# Patient Record
Sex: Male | Born: 1940 | Race: White | Hispanic: No | Marital: Single | Smoking: Former smoker
Health system: Southern US, Community
[De-identification: ages and names within clinical notes are randomized; demographics above are authoritative.]

## PROBLEM LIST (undated history)

## (undated) DIAGNOSIS — Z87891 Personal history of nicotine dependence: Secondary | ICD-10-CM

## (undated) DIAGNOSIS — T300 Burn of unspecified body region, unspecified degree: Secondary | ICD-10-CM

## (undated) DIAGNOSIS — I2510 Atherosclerotic heart disease of native coronary artery without angina pectoris: Secondary | ICD-10-CM

## (undated) DIAGNOSIS — E119 Type 2 diabetes mellitus without complications: Secondary | ICD-10-CM

## (undated) DIAGNOSIS — E7800 Pure hypercholesterolemia, unspecified: Secondary | ICD-10-CM

## (undated) DIAGNOSIS — E669 Obesity, unspecified: Secondary | ICD-10-CM

## (undated) DIAGNOSIS — F419 Anxiety disorder, unspecified: Secondary | ICD-10-CM

## (undated) DIAGNOSIS — E785 Hyperlipidemia, unspecified: Secondary | ICD-10-CM

## (undated) HISTORY — PX: CORONARY ARTERY BYPASS GRAFT: SHX141

## (undated) HISTORY — DX: Hyperlipidemia, unspecified: E78.5

## (undated) HISTORY — DX: Burn of unspecified body region, unspecified degree: T30.0

## (undated) HISTORY — DX: Personal history of nicotine dependence: Z87.891

## (undated) HISTORY — DX: Obesity, unspecified: E66.9

---

## 2003-08-26 ENCOUNTER — Emergency Department (HOSPITAL_COMMUNITY): Admission: EM | Admit: 2003-08-26 | Discharge: 2003-08-26 | Payer: Self-pay | Admitting: Family Medicine

## 2004-02-07 ENCOUNTER — Emergency Department (HOSPITAL_COMMUNITY): Admission: EM | Admit: 2004-02-07 | Discharge: 2004-02-07 | Payer: Self-pay | Admitting: Family Medicine

## 2004-04-17 ENCOUNTER — Emergency Department (HOSPITAL_COMMUNITY): Admission: EM | Admit: 2004-04-17 | Discharge: 2004-04-17 | Payer: Self-pay | Admitting: Emergency Medicine

## 2004-06-01 ENCOUNTER — Emergency Department (HOSPITAL_COMMUNITY): Admission: EM | Admit: 2004-06-01 | Discharge: 2004-06-01 | Payer: Self-pay | Admitting: Family Medicine

## 2004-07-05 ENCOUNTER — Emergency Department (HOSPITAL_COMMUNITY): Admission: EM | Admit: 2004-07-05 | Discharge: 2004-07-05 | Payer: Self-pay | Admitting: Family Medicine

## 2004-08-31 ENCOUNTER — Emergency Department (HOSPITAL_COMMUNITY): Admission: EM | Admit: 2004-08-31 | Discharge: 2004-08-31 | Payer: Self-pay | Admitting: Family Medicine

## 2004-10-21 ENCOUNTER — Emergency Department (HOSPITAL_COMMUNITY): Admission: EM | Admit: 2004-10-21 | Discharge: 2004-10-21 | Payer: Self-pay | Admitting: Family Medicine

## 2004-11-30 ENCOUNTER — Emergency Department (HOSPITAL_COMMUNITY): Admission: AD | Admit: 2004-11-30 | Discharge: 2004-11-30 | Payer: Self-pay | Admitting: Family Medicine

## 2005-02-13 ENCOUNTER — Emergency Department (HOSPITAL_COMMUNITY): Admission: EM | Admit: 2005-02-13 | Discharge: 2005-02-13 | Payer: Self-pay | Admitting: Family Medicine

## 2005-07-05 ENCOUNTER — Emergency Department (HOSPITAL_COMMUNITY): Admission: EM | Admit: 2005-07-05 | Discharge: 2005-07-05 | Payer: Self-pay | Admitting: Family Medicine

## 2007-02-16 ENCOUNTER — Emergency Department (HOSPITAL_COMMUNITY): Admission: EM | Admit: 2007-02-16 | Discharge: 2007-02-16 | Payer: Self-pay | Admitting: Emergency Medicine

## 2009-11-20 ENCOUNTER — Inpatient Hospital Stay (HOSPITAL_COMMUNITY): Admission: AD | Admit: 2009-11-20 | Discharge: 2009-12-03 | Payer: Self-pay | Admitting: Cardiovascular Disease

## 2009-11-21 ENCOUNTER — Ambulatory Visit: Payer: Self-pay | Admitting: Thoracic Surgery (Cardiothoracic Vascular Surgery)

## 2009-11-21 ENCOUNTER — Encounter: Payer: Self-pay | Admitting: Thoracic Surgery (Cardiothoracic Vascular Surgery)

## 2009-11-29 ENCOUNTER — Ambulatory Visit: Payer: Self-pay | Admitting: Physical Medicine & Rehabilitation

## 2009-12-25 ENCOUNTER — Encounter
Admission: RE | Admit: 2009-12-25 | Discharge: 2009-12-25 | Payer: Self-pay | Admitting: Thoracic Surgery (Cardiothoracic Vascular Surgery)

## 2009-12-25 ENCOUNTER — Ambulatory Visit: Payer: Self-pay | Admitting: Thoracic Surgery (Cardiothoracic Vascular Surgery)

## 2010-09-22 LAB — BLOOD GAS, ARTERIAL
FIO2: 0.21 %
Patient temperature: 98.6
TCO2: 21 mmol/L (ref 0–100)
pCO2 arterial: 29.7 mmHg — ABNORMAL LOW (ref 35.0–45.0)
pH, Arterial: 7.446 (ref 7.350–7.450)

## 2010-09-22 LAB — POCT I-STAT, CHEM 8
BUN: 11 mg/dL (ref 6–23)
Calcium, Ion: 1.05 mmol/L — ABNORMAL LOW (ref 1.12–1.32)
Creatinine, Ser: 0.7 mg/dL (ref 0.4–1.5)
Creatinine, Ser: 0.7 mg/dL (ref 0.4–1.5)
Glucose, Bld: 165 mg/dL — ABNORMAL HIGH (ref 70–99)
HCT: 32 % — ABNORMAL LOW (ref 39.0–52.0)
Hemoglobin: 10.2 g/dL — ABNORMAL LOW (ref 13.0–17.0)
Hemoglobin: 10.9 g/dL — ABNORMAL LOW (ref 13.0–17.0)
Potassium: 3.8 mEq/L (ref 3.5–5.1)
Potassium: 4.1 mEq/L (ref 3.5–5.1)
Sodium: 133 mEq/L — ABNORMAL LOW (ref 135–145)
Sodium: 134 mEq/L — ABNORMAL LOW (ref 135–145)
Sodium: 138 mEq/L (ref 135–145)
TCO2: 21 mmol/L (ref 0–100)
TCO2: 23 mmol/L (ref 0–100)
TCO2: 23 mmol/L (ref 0–100)

## 2010-09-22 LAB — APTT: aPTT: 25 seconds (ref 24–37)

## 2010-09-22 LAB — POCT I-STAT 3, ART BLOOD GAS (G3+)
Acid-base deficit: 5 mmol/L — ABNORMAL HIGH (ref 0.0–2.0)
Bicarbonate: 22.7 mEq/L (ref 20.0–24.0)
Bicarbonate: 24.3 mEq/L — ABNORMAL HIGH (ref 20.0–24.0)
O2 Saturation: 100 %
Patient temperature: 35.9
Patient temperature: 37.5
TCO2: 22 mmol/L (ref 0–100)
TCO2: 24 mmol/L (ref 0–100)
pCO2 arterial: 23.2 mmHg — ABNORMAL LOW (ref 35.0–45.0)
pCO2 arterial: 38.9 mmHg (ref 35.0–45.0)
pH, Arterial: 7.336 — ABNORMAL LOW (ref 7.350–7.450)
pH, Arterial: 7.369 (ref 7.350–7.450)
pH, Arterial: 7.412 (ref 7.350–7.450)
pO2, Arterial: 230 mmHg — ABNORMAL HIGH (ref 80.0–100.0)
pO2, Arterial: 311 mmHg — ABNORMAL HIGH (ref 80.0–100.0)
pO2, Arterial: 84 mmHg (ref 80.0–100.0)

## 2010-09-22 LAB — POCT I-STAT 4, (NA,K, GLUC, HGB,HCT)
Glucose, Bld: 155 mg/dL — ABNORMAL HIGH (ref 70–99)
Glucose, Bld: 156 mg/dL — ABNORMAL HIGH (ref 70–99)
HCT: 26 % — ABNORMAL LOW (ref 39.0–52.0)
HCT: 30 % — ABNORMAL LOW (ref 39.0–52.0)
Hemoglobin: 12.2 g/dL — ABNORMAL LOW (ref 13.0–17.0)
Hemoglobin: 8.8 g/dL — ABNORMAL LOW (ref 13.0–17.0)
Hemoglobin: 8.8 g/dL — ABNORMAL LOW (ref 13.0–17.0)
Potassium: 3.7 mEq/L (ref 3.5–5.1)
Potassium: 4 mEq/L (ref 3.5–5.1)
Sodium: 131 mEq/L — ABNORMAL LOW (ref 135–145)

## 2010-09-22 LAB — CBC
HCT: 24.7 % — ABNORMAL LOW (ref 39.0–52.0)
HCT: 28 % — ABNORMAL LOW (ref 39.0–52.0)
HCT: 42.4 % (ref 39.0–52.0)
Hemoglobin: 10.3 g/dL — ABNORMAL LOW (ref 13.0–17.0)
Hemoglobin: 13.6 g/dL (ref 13.0–17.0)
Hemoglobin: 14.8 g/dL (ref 13.0–17.0)
Hemoglobin: 8.7 g/dL — ABNORMAL LOW (ref 13.0–17.0)
Hemoglobin: 9.7 g/dL — ABNORMAL LOW (ref 13.0–17.0)
MCHC: 34.7 g/dL (ref 30.0–36.0)
MCHC: 34.7 g/dL (ref 30.0–36.0)
MCHC: 34.8 g/dL (ref 30.0–36.0)
MCHC: 35.1 g/dL (ref 30.0–36.0)
MCHC: 35.5 g/dL (ref 30.0–36.0)
MCV: 91.8 fL (ref 78.0–100.0)
MCV: 92.3 fL (ref 78.0–100.0)
MCV: 92.7 fL (ref 78.0–100.0)
MCV: 92.8 fL (ref 78.0–100.0)
MCV: 93.5 fL (ref 78.0–100.0)
Platelets: 112 10*3/uL — ABNORMAL LOW (ref 150–400)
Platelets: 135 10*3/uL — ABNORMAL LOW (ref 150–400)
Platelets: 146 10*3/uL — ABNORMAL LOW (ref 150–400)
Platelets: 148 10*3/uL — ABNORMAL LOW (ref 150–400)
Platelets: 186 10*3/uL (ref 150–400)
Platelets: 260 10*3/uL (ref 150–400)
Platelets: 498 10*3/uL — ABNORMAL HIGH (ref 150–400)
RBC: 2.95 MIL/uL — ABNORMAL LOW (ref 4.22–5.81)
RBC: 3.03 MIL/uL — ABNORMAL LOW (ref 4.22–5.81)
RBC: 3.15 MIL/uL — ABNORMAL LOW (ref 4.22–5.81)
RBC: 3.41 MIL/uL — ABNORMAL LOW (ref 4.22–5.81)
RBC: 3.64 MIL/uL — ABNORMAL LOW (ref 4.22–5.81)
RBC: 4.21 MIL/uL — ABNORMAL LOW (ref 4.22–5.81)
RDW: 13.1 % (ref 11.5–15.5)
RDW: 13.3 % (ref 11.5–15.5)
WBC: 11.2 10*3/uL — ABNORMAL HIGH (ref 4.0–10.5)
WBC: 11.4 10*3/uL — ABNORMAL HIGH (ref 4.0–10.5)
WBC: 12.2 10*3/uL — ABNORMAL HIGH (ref 4.0–10.5)
WBC: 13.3 10*3/uL — ABNORMAL HIGH (ref 4.0–10.5)
WBC: 13.3 10*3/uL — ABNORMAL HIGH (ref 4.0–10.5)
WBC: 13.6 10*3/uL — ABNORMAL HIGH (ref 4.0–10.5)
WBC: 14 10*3/uL — ABNORMAL HIGH (ref 4.0–10.5)
WBC: 14.6 10*3/uL — ABNORMAL HIGH (ref 4.0–10.5)

## 2010-09-22 LAB — BASIC METABOLIC PANEL
BUN: 10 mg/dL (ref 6–23)
BUN: 10 mg/dL (ref 6–23)
BUN: 11 mg/dL (ref 6–23)
BUN: 6 mg/dL (ref 6–23)
BUN: 8 mg/dL (ref 6–23)
BUN: 9 mg/dL (ref 6–23)
CO2: 23 mEq/L (ref 19–32)
CO2: 26 mEq/L (ref 19–32)
Calcium: 6.9 mg/dL — ABNORMAL LOW (ref 8.4–10.5)
Calcium: 7.4 mg/dL — ABNORMAL LOW (ref 8.4–10.5)
Calcium: 8.5 mg/dL (ref 8.4–10.5)
Calcium: 8.7 mg/dL (ref 8.4–10.5)
Chloride: 100 mEq/L (ref 96–112)
Chloride: 102 mEq/L (ref 96–112)
Chloride: 104 mEq/L (ref 96–112)
Creatinine, Ser: 0.62 mg/dL (ref 0.4–1.5)
Creatinine, Ser: 0.64 mg/dL (ref 0.4–1.5)
Creatinine, Ser: 0.64 mg/dL (ref 0.4–1.5)
Creatinine, Ser: 0.66 mg/dL (ref 0.4–1.5)
Creatinine, Ser: 0.66 mg/dL (ref 0.4–1.5)
GFR calc Af Amer: >60 mL/min (ref >60)
GFR calc Af Amer: >60 mL/min (ref >60)
GFR calc Af Amer: >60 mL/min (ref >60)
GFR calc non Af Amer: >60 mL/min (ref >60)
GFR calc non Af Amer: >60 mL/min (ref >60)
GFR calc non Af Amer: >60 mL/min (ref >60)
GFR calc non Af Amer: >60 mL/min (ref >60)
Glucose, Bld: 113 mg/dL — ABNORMAL HIGH (ref 70–99)
Glucose, Bld: 117 mg/dL — ABNORMAL HIGH (ref 70–99)
Glucose, Bld: 138 mg/dL — ABNORMAL HIGH (ref 70–99)
Potassium: 4 mEq/L (ref 3.5–5.1)
Potassium: 4.2 mEq/L (ref 3.5–5.1)
Sodium: 133 mEq/L — ABNORMAL LOW (ref 135–145)
Sodium: 134 mEq/L — ABNORMAL LOW (ref 135–145)

## 2010-09-22 LAB — GLUCOSE, CAPILLARY
Glucose-Capillary: 104 mg/dL — ABNORMAL HIGH (ref 70–99)
Glucose-Capillary: 105 mg/dL — ABNORMAL HIGH (ref 70–99)
Glucose-Capillary: 125 mg/dL — ABNORMAL HIGH (ref 70–99)
Glucose-Capillary: 133 mg/dL — ABNORMAL HIGH (ref 70–99)
Glucose-Capillary: 135 mg/dL — ABNORMAL HIGH (ref 70–99)
Glucose-Capillary: 135 mg/dL — ABNORMAL HIGH (ref 70–99)
Glucose-Capillary: 138 mg/dL — ABNORMAL HIGH (ref 70–99)
Glucose-Capillary: 140 mg/dL — ABNORMAL HIGH (ref 70–99)
Glucose-Capillary: 142 mg/dL — ABNORMAL HIGH (ref 70–99)
Glucose-Capillary: 147 mg/dL — ABNORMAL HIGH (ref 70–99)
Glucose-Capillary: 147 mg/dL — ABNORMAL HIGH (ref 70–99)
Glucose-Capillary: 151 mg/dL — ABNORMAL HIGH (ref 70–99)
Glucose-Capillary: 153 mg/dL — ABNORMAL HIGH (ref 70–99)
Glucose-Capillary: 158 mg/dL — ABNORMAL HIGH (ref 70–99)
Glucose-Capillary: 161 mg/dL — ABNORMAL HIGH (ref 70–99)
Glucose-Capillary: 165 mg/dL — ABNORMAL HIGH (ref 70–99)
Glucose-Capillary: 166 mg/dL — ABNORMAL HIGH (ref 70–99)
Glucose-Capillary: 169 mg/dL — ABNORMAL HIGH (ref 70–99)
Glucose-Capillary: 176 mg/dL — ABNORMAL HIGH (ref 70–99)
Glucose-Capillary: 180 mg/dL — ABNORMAL HIGH (ref 70–99)
Glucose-Capillary: 183 mg/dL — ABNORMAL HIGH (ref 70–99)
Glucose-Capillary: 183 mg/dL — ABNORMAL HIGH (ref 70–99)
Glucose-Capillary: 203 mg/dL — ABNORMAL HIGH (ref 70–99)
Glucose-Capillary: 206 mg/dL — ABNORMAL HIGH (ref 70–99)
Glucose-Capillary: 215 mg/dL — ABNORMAL HIGH (ref 70–99)
Glucose-Capillary: 217 mg/dL — ABNORMAL HIGH (ref 70–99)
Glucose-Capillary: 71 mg/dL (ref 70–99)
Glucose-Capillary: 79 mg/dL (ref 70–99)
Glucose-Capillary: 86 mg/dL (ref 70–99)
Glucose-Capillary: 87 mg/dL (ref 70–99)
Glucose-Capillary: 88 mg/dL (ref 70–99)
Glucose-Capillary: 90 mg/dL (ref 70–99)
Glucose-Capillary: 92 mg/dL (ref 70–99)

## 2010-09-22 LAB — CROSSMATCH: ABO/RH(D): A POS

## 2010-09-22 LAB — POCT I-STAT 3, VENOUS BLOOD GAS (G3P V)
Bicarbonate: 21.2 mEq/L (ref 20.0–24.0)
pH, Ven: 7.471 — ABNORMAL HIGH (ref 7.250–7.300)
pO2, Ven: 28 mmHg — CL (ref 30.0–45.0)

## 2010-09-22 LAB — CREATININE, SERUM
Creatinine, Ser: 0.65 mg/dL (ref 0.4–1.5)
Creatinine, Ser: 0.74 mg/dL (ref 0.4–1.5)
GFR calc Af Amer: >60 mL/min (ref >60)
GFR calc Af Amer: >60 mL/min (ref >60)
GFR calc non Af Amer: >60 mL/min (ref >60)

## 2010-09-22 LAB — URINALYSIS, ROUTINE W REFLEX MICROSCOPIC
Bilirubin Urine: NEGATIVE
Glucose, UA: NEGATIVE mg/dL
Hgb urine dipstick: NEGATIVE
Ketones, ur: 40 mg/dL — AB
Nitrite: NEGATIVE
Protein, ur: 30 mg/dL — AB
pH: 6 (ref 5.0–8.0)

## 2010-09-22 LAB — COMPREHENSIVE METABOLIC PANEL
AST: 20 U/L (ref 0–37)
AST: 20 U/L (ref 0–37)
Albumin: 3.3 g/dL — ABNORMAL LOW (ref 3.5–5.2)
BUN: 13 mg/dL (ref 6–23)
CO2: 23 mEq/L (ref 19–32)
CO2: 25 mEq/L (ref 19–32)
Calcium: 8 mg/dL — ABNORMAL LOW (ref 8.4–10.5)
Calcium: 8.4 mg/dL (ref 8.4–10.5)
Chloride: 98 mEq/L (ref 96–112)
Creatinine, Ser: 0.67 mg/dL (ref 0.4–1.5)
Creatinine, Ser: 0.94 mg/dL (ref 0.4–1.5)
GFR calc Af Amer: >60 mL/min (ref >60)
GFR calc Af Amer: >60 mL/min (ref >60)
GFR calc non Af Amer: >60 mL/min (ref >60)
GFR calc non Af Amer: >60 mL/min (ref >60)
Sodium: 129 mEq/L — ABNORMAL LOW (ref 135–145)
Total Protein: 6.1 g/dL (ref 6.0–8.3)
Total Protein: 6.9 g/dL (ref 6.0–8.3)

## 2010-09-22 LAB — MAGNESIUM
Magnesium: 2.6 mg/dL — ABNORMAL HIGH (ref 1.5–2.5)
Magnesium: 3.6 mg/dL — ABNORMAL HIGH (ref 1.5–2.5)

## 2010-09-22 LAB — HEMOGLOBIN AND HEMATOCRIT, BLOOD: Hemoglobin: 8.9 g/dL — ABNORMAL LOW (ref 13.0–17.0)

## 2010-09-22 LAB — PROTIME-INR
INR: 1.03 (ref 0.00–1.49)
INR: 1.36 (ref 0.00–1.49)
Prothrombin Time: 16.7 seconds — ABNORMAL HIGH (ref 11.6–15.2)

## 2010-09-22 LAB — MRSA PCR SCREENING: MRSA by PCR: NEGATIVE

## 2010-09-22 LAB — POCT I-STAT GLUCOSE: Glucose, Bld: 136 mg/dL — ABNORMAL HIGH (ref 70–99)

## 2010-09-22 LAB — ABO/RH: ABO/RH(D): A POS

## 2010-09-22 LAB — CARDIAC PANEL(CRET KIN+CKTOT+MB+TROPI)
Relative Index: INVALID (ref 0.0–2.5)
Total CK: 50 U/L (ref 7–232)

## 2010-11-18 NOTE — Assessment & Plan Note (Signed)
OFFICE VISIT   [name], [name]  DOB:  1940/12/16                                        December 25, 2009  CHART #:  [id number]   HISTORY:  The patient is a 69 year old gentleman who presented back in  May with unstable angina.  He had critical left main and three-vessel  disease and underwent coronary bypass grafting x5 on May 20.  Postoperatively, his course was uncomplicated.  He was discharged on May  30 and to a skilled nursing facility.  He now is at home and doing well.  He has been walking on a regular basis.  He says he does get tired in  the afternoon and have to take a nap.  Usually takes one pain pill  before he goes to bed and sometimes will wake at middle of the night and  take the second one, but he has not been able to take them during the  day at all.  He says his exercise tolerance is good.  He has not had any  recurrent anginal-type symptoms.   His current medications are Tussionex 5 mL t.i.d. p.r.n., Lopressor 25  mg b.i.d., oxycodone 5 mg p.r.n., simvastatin 40 mg daily, aspirin 325  mg daily, NovoLog 70/30 insulin, and also occasionally using Tylenol PM  for sleep.   He has no known drug allergies.   PHYSICAL EXAMINATION:  His blood pressure is 134/79, pulse 99,  respirations 18, oxygen saturation is 98%.  Neurologically, he is alert  and oriented x3 with no focal deficits.  His lungs are clear with equal  breath sounds bilaterally.  His cardiac exam has regular rate and  rhythm.  Normal S1 and S2.  No rubs, murmurs, or gallops.  Sternal  incision is clean, dry, and intact.  Rash on his right anterior chest  wall is nearly completely resolved.  His sternum is stable.  There is no  sign of infections.  Leg wounds healing well.  There is no peripheral  edema.   Chest x-ray shows postoperative changes and no significant effusions or  infiltrates.   IMPRESSION:  The patient is doing well at this point in time.  He is now  about a month  out from urgent surgery for critical left main disease.  His exercise tolerance is good.  He is having minimal discomfort.  I did  give him a prescription for Ambien 5 mg tablets one p.o. q.h.s. p.r.n.  at his request to help him sleep at night.  I did caution him not to  lift any objects greater than 10 pounds for another 3 weeks.  I  encouraged him to continue increasing his exercise as tolerated.  He may  begin driving.  Appropriate precautions were discussed.  He will follow  up with Dr. Tresa Endo.   I would be happy to see him back in the future if I can be of any  further assistance with his care.   Salvatore Decent Dorris Fetch, M.D.  Electronically Signed   SCH/MEDQ  D:  12/25/2009  T:  12/26/2009  Job:  [id number]

## 2011-09-14 ENCOUNTER — Encounter (HOSPITAL_BASED_OUTPATIENT_CLINIC_OR_DEPARTMENT_OTHER): Payer: Medicare Other | Attending: Internal Medicine

## 2011-09-14 DIAGNOSIS — Z7982 Long term (current) use of aspirin: Secondary | ICD-10-CM | POA: Insufficient documentation

## 2011-09-14 DIAGNOSIS — Z79899 Other long term (current) drug therapy: Secondary | ICD-10-CM | POA: Insufficient documentation

## 2011-09-14 DIAGNOSIS — E1169 Type 2 diabetes mellitus with other specified complication: Secondary | ICD-10-CM | POA: Insufficient documentation

## 2011-09-14 DIAGNOSIS — I2510 Atherosclerotic heart disease of native coronary artery without angina pectoris: Secondary | ICD-10-CM | POA: Insufficient documentation

## 2011-09-14 DIAGNOSIS — Z794 Long term (current) use of insulin: Secondary | ICD-10-CM | POA: Insufficient documentation

## 2011-09-14 DIAGNOSIS — G5790 Unspecified mononeuropathy of unspecified lower limb: Secondary | ICD-10-CM | POA: Insufficient documentation

## 2011-09-14 DIAGNOSIS — L97509 Non-pressure chronic ulcer of other part of unspecified foot with unspecified severity: Secondary | ICD-10-CM | POA: Insufficient documentation

## 2011-09-15 NOTE — Progress Notes (Signed)
[facility]  NAME:  DIETER, HANE NO.:  [id number]  MEDICAL RECORD NO.:  [id number]      DATE OF BIRTH:  [birth year]  PHYSICIAN:  Jonelle Sports. Lyberti Thrush, M.D.  VISIT DATE:  09/14/2011                                  OFFICE VISIT   HISTORY:  This 71 year old white male with longstanding diabetes, on insulin, is seen for evaluation of several injured areas on his feet.  The patient is somewhat vague about the details of how he wounded his feet, but apparently was doing a great deal traveling and was in a snow situation without adequate boots and his feet got quite wet.  He noticed after that some blister-like lesions on the toes and one on the heel and although I suppose it is remotely possible, these could be frost bite, I do not honestly think so in that they fairly quickly spontaneously improved.  Then subsequently, he got his feet wet again and has noted the reappearance of lesions on the tip of both halluces and also on the left second toe.  He notes that he had been wearing two pairs of socks in an effort to protect his feet and as I will discuss below, I think this may well have been a factor in some of the causation of this second series of wounds.  Whatever the wounds have not drained, have not been malodorous, have not been particularly painful and have not been associated with any systemic symptoms.  PAST MEDICAL HISTORY:  Operations include coronary artery disease with stent placements.  Other hospitalizations for depression and pancreatitis.  His regular medications are; 1. NovoLog insulin 70/30 twice daily. 2. Ambien 10 mg nightly at bedtime. 3. Aspirin 81 mg daily. 4. Fenofibrate 160 mg daily. 5. Lisinopril 5 mg daily, and he apparently was recently given a brief     course of Keflex 500 mg b.i.d. in association with these problems.     He also takes over-the-counter Unisom and a multiple vitamin.  He has no known medicinal  allergies.  FAMILY HISTORY:  Notable for diabetes type 2 and hypertension.  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  REVIEW OF SYSTEMS:  The patient has no awareness of any problems with his eyes related to diabetes and has no current visual symptomatology. CARDIOVASCULAR:  He has had no ongoing symptoms since his coronary intervention in 2011.  He has no history of pneumonia, emphysema or pulmonary disease.  He denies smoking or use of alcoholic beverages.  He has not had any additional abdominal pain since the time of his episode of pancreatitis, has no change in bowel habits.  No dyspepsia.  No jaundice or pruritus.  Bones and joints are in good shape with no major traumas in the past and no joint surgeries.  Neurologically, he has no history of stroke or seizures.  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  PHYSICAL EXAMINATION:  VITAL SIGNS:  Blood pressure is 163/87, pulse 95 and regular, respirations 18, temperature 98.6. GENERAL:  This is an alert, cooperative, but anxious and talkative gentleman who appears his stated  age of 89 years.  His random blood glucose is 120 mg/dL.  His height incidentally is 5 feet 7 inches, weight 185 pounds. SKIN:  The skin is warm and dry with normal texture, turgor and color. He has hair growth present all the way onto the dorsum of both feet. HEENT:  His mucous membranes are moist and pink. NECK:  Supple.  There is no venous engorgement.  No thyroid enlargement. No carotid bruits. CHEST:  His chest is grossly clear to auscultation and percussion. CARDIOVASCULAR:  His heart tones are of good quality and I hear no definite murmur or gallop not even S4. ABDOMEN:  His abdomen is soft, nontender without apparent organomegaly or masses. EXTREMITIES:  Free of edema.  All pulses are  palpable and he has good ABIs of 1.06 on the left and 0.99 on the right.  He has a slight open ulceration in the area of hemorrhagic trauma on the tip of his right hallux and he has a hematoma unruptured on the tip of his left hallux. On the plantar aspect of the distal phalanx of his left second toe, which is slightly hammertoe in configuration, there is a small open ulcer measuring 1.0 x 1.2 x 0.1 cm with no evidence of surrounding erythema or other such problems.  IMPRESSION:  Diabetic foot ulcers, all Wagner 2 likely secondary to thermal trauma and ill-fitting shoes.  DISPOSITION:  Incidentally, I have today looked to the shoes because these lesions on his toes appear to have been secondary to shoes that are too tightly fitting, which is always a hazard with neuropathy. Particularly in that, he has been wearing two pairs of socks in an effort to protect his toes.  Paradoxically, he has caused the tight- fitting shoes to be even tighter and I think this is clearly a factor in his present injury.  He certainly has good circulation.  There was no evidence of current infection and these should be expected to heal.  I have debrided some loose skin from the tip of the right hallux and also from the ulcer on the left second toe.  The right hallux was then dressed with application of collagen and a toe sock type dressing as is the left second toe.  The left hallux is left undressed.  He is later returned to his socks, but is placed in healing sandal bilaterally as opposed to his previous shoes.  He was given a prescription and a recommendation that he can shop to the shoe market on [address], and get to shoes of adequate length, width and depth as well as some diabetic inserts.  His followup visit will be here in 1 week.  He was advised not to change his dressings in the interim.          ______________________________ Jonelle Sports Cheryll Cockayne, M.D.     RES/MEDQ  D:   09/14/2011  T:  09/15/2011  Job:  [id number]

## 2011-10-05 ENCOUNTER — Encounter (HOSPITAL_BASED_OUTPATIENT_CLINIC_OR_DEPARTMENT_OTHER): Payer: Medicare Other | Attending: Internal Medicine

## 2011-10-05 DIAGNOSIS — Z79899 Other long term (current) drug therapy: Secondary | ICD-10-CM | POA: Insufficient documentation

## 2011-10-05 DIAGNOSIS — L97509 Non-pressure chronic ulcer of other part of unspecified foot with unspecified severity: Secondary | ICD-10-CM | POA: Insufficient documentation

## 2011-10-05 DIAGNOSIS — E1169 Type 2 diabetes mellitus with other specified complication: Secondary | ICD-10-CM | POA: Insufficient documentation

## 2011-10-12 ENCOUNTER — Encounter (HOSPITAL_BASED_OUTPATIENT_CLINIC_OR_DEPARTMENT_OTHER): Payer: Medicare Other

## 2011-11-09 ENCOUNTER — Encounter (HOSPITAL_BASED_OUTPATIENT_CLINIC_OR_DEPARTMENT_OTHER): Payer: Medicare Other | Attending: Internal Medicine

## 2011-11-09 DIAGNOSIS — Z794 Long term (current) use of insulin: Secondary | ICD-10-CM | POA: Insufficient documentation

## 2011-11-09 DIAGNOSIS — L97509 Non-pressure chronic ulcer of other part of unspecified foot with unspecified severity: Secondary | ICD-10-CM | POA: Insufficient documentation

## 2011-11-09 DIAGNOSIS — E119 Type 2 diabetes mellitus without complications: Secondary | ICD-10-CM | POA: Insufficient documentation

## 2011-11-25 NOTE — Progress Notes (Signed)
[facility]  NAME:  SEAVER, MACHIA NO.:  MEDICAL RECORD NO.:  [id number]      DATE OF BIRTH:  [birth year]  PHYSICIAN:  Joanne Gavel, M.D.              VISIT DATE:                                  OFFICE VISIT   ADMITTING DATA:  A 71 year old male with long-standing diabetes, on insulin with several injured areas to his feet, ulcerations of the right great toe and left second toe.  The patient was treated basically with collagen and cleansing.  Abnormal skin was debrided when necessary.  At the time of discharge, the wound is completely healed.  DISCHARGE RECOMMENDATIONS:  That he avoid trauma and see [place] as necessary.     Joanne Gavel, M.D.     RA/MEDQ  D:  11/24/2011  T:  11/24/2011  Job:  [id number]

## 2011-12-07 ENCOUNTER — Encounter (HOSPITAL_BASED_OUTPATIENT_CLINIC_OR_DEPARTMENT_OTHER): Payer: Medicare Other | Attending: Internal Medicine

## 2011-12-07 DIAGNOSIS — L97509 Non-pressure chronic ulcer of other part of unspecified foot with unspecified severity: Secondary | ICD-10-CM | POA: Insufficient documentation

## 2011-12-07 DIAGNOSIS — E119 Type 2 diabetes mellitus without complications: Secondary | ICD-10-CM | POA: Insufficient documentation

## 2011-12-07 DIAGNOSIS — Z794 Long term (current) use of insulin: Secondary | ICD-10-CM | POA: Insufficient documentation

## 2012-05-09 ENCOUNTER — Encounter (HOSPITAL_BASED_OUTPATIENT_CLINIC_OR_DEPARTMENT_OTHER): Payer: Medicare Other | Attending: General Surgery

## 2012-05-09 DIAGNOSIS — E1169 Type 2 diabetes mellitus with other specified complication: Secondary | ICD-10-CM | POA: Insufficient documentation

## 2012-05-09 DIAGNOSIS — L97509 Non-pressure chronic ulcer of other part of unspecified foot with unspecified severity: Secondary | ICD-10-CM | POA: Insufficient documentation

## 2012-05-09 DIAGNOSIS — I2510 Atherosclerotic heart disease of native coronary artery without angina pectoris: Secondary | ICD-10-CM | POA: Insufficient documentation

## 2012-05-09 DIAGNOSIS — Z794 Long term (current) use of insulin: Secondary | ICD-10-CM | POA: Insufficient documentation

## 2012-05-09 DIAGNOSIS — I10 Essential (primary) hypertension: Secondary | ICD-10-CM | POA: Insufficient documentation

## 2012-05-09 NOTE — Progress Notes (Signed)
[facility]  NAME:  [name], [name] NO.:  [id number]  MEDICAL RECORD NO.:  [id number]      DATE OF BIRTH:  07/15/1940  PHYSICIAN:  Ardath Sax, M.D.           VISIT DATE:                                  OFFICE VISIT   This is a 71 year old gentleman who is an insulin-dependent diabetic and has been to our clinic before because of diabetic foot ulcers.  He presently has a Wagner 1 diabetic foot ulcer on his distal phalanx of his right 2nd toe.  I debrided the blister off and dermis appears to be like it will regenerate.  He has already got diabetic shoes and I put silver alginate on it and showed him how to do it at home and he will take care of this at home himself and come back next week.  His temperature is 98.6, his blood pressure was 120/84, his respirations 19, his pulse is 70.  He has good pulses in his feet.  He has had open heart surgery.  He has also had stents replacement.  He has also had pancreatitis in the past and his medicines are only insulin and simvastatin and lisinopril.  He will return in a week.  DIAGNOSES:  Insulin-dependent diabetes, hypertension, history of coronary artery disease, and a diabetic foot ulcer on his distal phalanx right 2nd toe.     Ardath Sax, M.D.     PP/MEDQ  D:  05/09/2012  T:  05/09/2012  Job:  [id number]

## 2012-05-24 ENCOUNTER — Other Ambulatory Visit (HOSPITAL_COMMUNITY): Payer: Self-pay | Admitting: General Surgery

## 2012-05-24 DIAGNOSIS — I739 Peripheral vascular disease, unspecified: Secondary | ICD-10-CM

## 2012-05-30 ENCOUNTER — Encounter (HOSPITAL_COMMUNITY): Payer: Medicare Other

## 2012-06-06 ENCOUNTER — Encounter (HOSPITAL_BASED_OUTPATIENT_CLINIC_OR_DEPARTMENT_OTHER): Payer: Medicare Other

## 2012-06-09 ENCOUNTER — Encounter (HOSPITAL_BASED_OUTPATIENT_CLINIC_OR_DEPARTMENT_OTHER): Payer: Medicare Other | Attending: General Surgery

## 2012-06-09 DIAGNOSIS — L97509 Non-pressure chronic ulcer of other part of unspecified foot with unspecified severity: Secondary | ICD-10-CM | POA: Insufficient documentation

## 2012-06-09 DIAGNOSIS — E1169 Type 2 diabetes mellitus with other specified complication: Secondary | ICD-10-CM | POA: Insufficient documentation

## 2012-06-09 DIAGNOSIS — L84 Corns and callosities: Secondary | ICD-10-CM | POA: Insufficient documentation

## 2012-06-10 ENCOUNTER — Encounter (HOSPITAL_BASED_OUTPATIENT_CLINIC_OR_DEPARTMENT_OTHER): Payer: Medicare Other

## 2012-06-14 ENCOUNTER — Encounter (HOSPITAL_COMMUNITY): Payer: Medicare Other

## 2012-07-08 ENCOUNTER — Encounter (HOSPITAL_BASED_OUTPATIENT_CLINIC_OR_DEPARTMENT_OTHER): Payer: Medicare Other | Attending: General Surgery

## 2012-07-08 DIAGNOSIS — I10 Essential (primary) hypertension: Secondary | ICD-10-CM | POA: Insufficient documentation

## 2012-07-08 DIAGNOSIS — L97509 Non-pressure chronic ulcer of other part of unspecified foot with unspecified severity: Secondary | ICD-10-CM | POA: Insufficient documentation

## 2012-07-08 DIAGNOSIS — I2510 Atherosclerotic heart disease of native coronary artery without angina pectoris: Secondary | ICD-10-CM | POA: Insufficient documentation

## 2012-07-08 DIAGNOSIS — Z79899 Other long term (current) drug therapy: Secondary | ICD-10-CM | POA: Insufficient documentation

## 2012-07-08 DIAGNOSIS — E1169 Type 2 diabetes mellitus with other specified complication: Secondary | ICD-10-CM | POA: Insufficient documentation

## 2012-07-16 ENCOUNTER — Emergency Department (HOSPITAL_COMMUNITY)
Admission: EM | Admit: 2012-07-16 | Discharge: 2012-07-16 | Disposition: A | Discharge status: Home / Self Care | Payer: Medicare Other | Source: Home / Self Care | Attending: Family Medicine | Admitting: Family Medicine

## 2012-07-16 ENCOUNTER — Encounter (HOSPITAL_COMMUNITY): Payer: Self-pay | Admitting: Emergency Medicine

## 2012-07-16 DIAGNOSIS — H43399 Other vitreous opacities, unspecified eye: Secondary | ICD-10-CM

## 2012-07-16 DIAGNOSIS — L8990 Pressure ulcer of unspecified site, unspecified stage: Secondary | ICD-10-CM

## 2012-07-16 DIAGNOSIS — L8992 Pressure ulcer of unspecified site, stage 2: Secondary | ICD-10-CM

## 2012-07-16 HISTORY — DX: Pure hypercholesterolemia, unspecified: E78.00

## 2012-07-16 HISTORY — DX: Atherosclerotic heart disease of native coronary artery without angina pectoris: I25.10

## 2012-07-16 HISTORY — DX: Type 2 diabetes mellitus without complications: E11.9

## 2012-07-16 NOTE — ED Provider Notes (Signed)
History     CSN: [id number]  Arrival date & time 07/16/12  1208   First MD Initiated Contact with Patient 07/16/12 1257      Chief Complaint  Patient presents with  . Eye Problem    (Consider location/radiation/quality/duration/timing/severity/associated sxs/prior treatment) HPI Comments: 72 year old male with history of diabetes. Here with the following concerns: #1) has a blister in his right second toe. Patient reports that he goes to [facility] for several blisters that he had in his toes. He is currently wearing postop shoes. He was seen last at the wound care center on Friday. Next day he developed a blister in his right second toe since yesterday. Clear drainage. Denies pain. Patient wants to have that blister drained. #2) floaters in his left eye. Reports seeing intermittent dark spots in the center of his vision on the left eye. Floaters are changing with periods of no symptoms. Patient reports that his visual acuity has not changed, and when there are no dark spots he can read at baseline. Denies eye pain or eye drainage. Denies headache, dizziness, extremity weakness, numbness or paresthesia. No problems with gait or balance.    Past Medical History  Diagnosis Date  . Diabetes mellitus without complication   . Coronary artery disease   . High cholesterol     Past Surgical History  Procedure Date  . Coronary artery bypass graft     No family history on file.  History  Substance Use Topics  . Smoking status: Never Smoker   . Smokeless tobacco: Not on file  . Alcohol Use: No      Review of Systems  Constitutional: Negative for fever, chills, diaphoresis, appetite change and fatigue.  HENT: Negative for congestion.   Eyes: Negative for photophobia, pain, discharge, redness and itching.       Scotomas as per HPI  Respiratory: Negative for shortness of breath.   Cardiovascular: Negative for chest pain, palpitations and leg swelling.    Gastrointestinal: Negative for nausea, vomiting and abdominal pain.  Skin:       Right Foot pressure sores as per HPI  Neurological: Negative for dizziness, tremors, seizures, syncope, facial asymmetry, speech difficulty, weakness, numbness and headaches.    Allergies  Review of patient's allergies indicates no known allergies.  Home Medications   Current Outpatient Rx  Name  Route  Sig  Dispense  Refill  . KEFLEX PO   Oral   Take by mouth.         . FENOFIBRATE MICRONIZED PO   Oral   Take by mouth.         . INSULIN ASPART 100 UNIT/ML [place] SOLN   Subcutaneous   Inject into the skin 2 (two) times daily. 40 units am 30-40 units pm         . LISINOPRIL PO   Oral   Take by mouth.         Marland Kitchen SIMVASTATIN PO   Oral   Take by mouth.           BP 139/70  Pulse 98  Temp 98.6 F (37 C) (Oral)  Resp 19  SpO2 95%  Physical Exam  Nursing note and vitals reviewed. Constitutional: He is oriented to person, place, and time. He appears well-developed and well-nourished. No distress.  HENT:  Head: Normocephalic and atraumatic.  Right Ear: External ear normal.  Left Ear: External ear normal.  Mouth/Throat: Oropharynx is clear and moist. No oropharyngeal exudate.  Eyes: Conjunctivae  normal and EOM are normal. Pupils are equal, round, and reactive to light. Right eye exhibits no discharge. Left eye exhibits no discharge. No scleral icterus.       Left eye: No nystagmus. Limited slit lamp exam: (pupil was not dilated prior exam) no corneal abrasions or foreign bodies. No conjunctival erythema or pericilliary injection. No anterior chamber hemorrhage or hypopyon.  Limited visualization of retina with no obvious hemorrhage.  Right eye normal similar exam.   Neck: No JVD present. No thyromegaly present.  Cardiovascular: Normal rate, regular rhythm and normal heart sounds.   Pulmonary/Chest: Effort normal and breath sounds normal.  Neurological: He is alert and oriented to  person, place, and time. No cranial nerve deficit. Coordination normal.  Skin: He is not diaphoretic.       Right foot: There is a white blister with clear transudate located in dorsum of right second toe. No exposed ulcer. No significant erythema or swelling associated. No signs of over infection.  Right toes is covered with elastic gauze.  Patient wearing post op shoes.     ED Course  Procedures (including critical care time)  Labs Reviewed - No data to display No results found.   1. Pressure ulcer with abrasion, blister, partial thickness skin loss involving epidermis and/or dermis   2. Visual floaters       MDM  Impress aseptic blister over partial thickness pressure sore. Educated patient about protective effect of a noninfected blister. Recommended to avoid rupturing or disturbing the blister. His shoe was extra padded and petroleum embedded gauze applied on top of the blister to avoid a skin rubbing against the shoe border. Discuss patient eye complaint and exam with the ophthalmologist on call he is willing to see patient in his clinic next Monday. Patient denies here changes in his visual acuity and visual acuity test does not show worsening compare with the right eye. No hemorrhagic or other acute findings on limited eye examination here. Patient was instructed to go to the emergency department if persistent floaters or new symptoms like eye pain or visual changes from baseline.        Sharin Grave, MD 07/18/12 [phone]

## 2012-07-16 NOTE — ED Notes (Signed)
Reports left eye vision changes that patient noticed yesterday, described as a cloud or "dark cloud".. Denies headache.  Patient continues to have a "floater" in vision field today.  Does decline this occuring this afternoon. Patient reports he currently goes to wound center.  Last seen Friday at wound center.  Patient reports toe 2 has a blister today that he reports was not there yesterday.

## 2012-07-22 ENCOUNTER — Other Ambulatory Visit (HOSPITAL_COMMUNITY): Payer: Self-pay | Admitting: Family Medicine

## 2012-07-22 DIAGNOSIS — H34239 Retinal artery branch occlusion, unspecified eye: Secondary | ICD-10-CM

## 2012-08-04 ENCOUNTER — Emergency Department (INDEPENDENT_AMBULATORY_CARE_PROVIDER_SITE_OTHER): Payer: Medicare Other

## 2012-08-04 ENCOUNTER — Encounter (HOSPITAL_COMMUNITY): Payer: Self-pay | Admitting: Emergency Medicine

## 2012-08-04 ENCOUNTER — Emergency Department (HOSPITAL_COMMUNITY)
Admission: EM | Admit: 2012-08-04 | Discharge: 2012-08-04 | Disposition: A | Discharge status: Home / Self Care | Payer: Medicare Other | Source: Home / Self Care

## 2012-08-04 DIAGNOSIS — K5900 Constipation, unspecified: Secondary | ICD-10-CM

## 2012-08-04 LAB — POCT URINALYSIS DIP (DEVICE)
Bilirubin Urine: NEGATIVE
Glucose, UA: NEGATIVE mg/dL
Hgb urine dipstick: NEGATIVE
Ketones, ur: NEGATIVE mg/dL
Specific Gravity, Urine: 1.015 (ref 1.005–1.030)

## 2012-08-04 NOTE — ED Provider Notes (Signed)
History     CSN: [id number]  Arrival date & time 08/04/12  1212   First MD Initiated Contact with Patient 08/04/12 1244      Chief Complaint  Patient presents with  . Abdominal Pain    (Consider location/radiation/quality/duration/timing/severity/associated sxs/prior treatment) HPI Comments: This is a 72 year old male with history of type 2 diabetes mellitus, CAD and dyslipidemia. He presents with several weeks of abdominal discomfort associated with constipation. More recently been complaining of left upper quadrant pain. The pain is intermittent and does not radiate. It is usually elicited with cough or hiccups. He has not had nausea, vomiting, Diarrhea or fever. Medications include cephalexin, fenofibrate, simvastatin and lisinopril. His diabetes is treated with NovoLog.   Past Medical History  Diagnosis Date  . Diabetes mellitus without complication   . Coronary artery disease   . High cholesterol     Past Surgical History  Procedure Date  . Coronary artery bypass graft     No family history on file.  History  Substance Use Topics  . Smoking status: Never Smoker   . Smokeless tobacco: Not on file  . Alcohol Use: No      Review of Systems  Constitutional: Negative.   Respiratory: Negative.   Cardiovascular: Negative for chest pain, palpitations and leg swelling.  Gastrointestinal: Positive for abdominal pain and constipation. Negative for nausea, vomiting, diarrhea, blood in stool, abdominal distention, anal bleeding and rectal pain.  Genitourinary: Negative.   Musculoskeletal: Negative.   Skin: Positive for wound.       Wounds to the feet, blisters associated with diabetic PVD.  Neurological: Negative.   Psychiatric/Behavioral: Negative.     Allergies  Review of patient's allergies indicates no known allergies.  Home Medications   Current Outpatient Rx  Name  Route  Sig  Dispense  Refill  . KEFLEX PO   Oral   Take by mouth.         . FENOFIBRATE  MICRONIZED PO   Oral   Take by mouth.         . INSULIN ASPART 100 UNIT/ML [place] SOLN   Subcutaneous   Inject into the skin 2 (two) times daily. 40 units am 30-40 units pm         . LISINOPRIL PO   Oral   Take by mouth.         Marland Kitchen SIMVASTATIN PO   Oral   Take by mouth.           BP 135/63  Pulse 89  Temp 98.9 F (37.2 C) (Oral)  Resp 20  SpO2 99%  Physical Exam  Nursing note and vitals reviewed. Constitutional: He is oriented to person, place, and time. He appears well-developed and well-nourished. No distress.       The patient is sitting in a chair with relaxed posturing. He is in no distress whatsoever and is currently denying pain anywhere. He appears generally well with no sick behavior.  HENT:  Head: Normocephalic and atraumatic.  Eyes: Conjunctivae normal and EOM are normal.  Neck: Normal range of motion. Neck supple.  Cardiovascular: Normal rate, regular rhythm and normal heart sounds.   Pulmonary/Chest: Breath sounds normal. No respiratory distress. He has no wheezes. He has no rales.  Abdominal: Soft. Bowel sounds are normal. He exhibits no distension. There is no tenderness. There is no rebound and no guarding.       Onset of her small umbilical hernia Upon sitting up a diastasis recti is noted.  Musculoskeletal: He exhibits no edema and no tenderness.  Lymphadenopathy:    He has no cervical adenopathy.  Neurological: He is alert and oriented to person, place, and time. He exhibits normal muscle tone.  Skin: Skin is warm and dry.  Psychiatric: He has a normal mood and affect.    ED Course  Procedures (including critical care time)   Labs Reviewed  POCT URINALYSIS DIP (DEVICE)   Dg Abd 1 View  08/04/2012  *RADIOLOGY REPORT*  Clinical Data: Abdominal pain, mid epigastric region  ABDOMEN - 1 VIEW  Comparison: Prior chest x-rays 12/25/2009  Findings: Nonobstructed bowel gas pattern.  Gas stool noted throughout the colon to the level of the rectum.  No  organomegaly. Moderate volume of formed stool throughout the colon.  No radiopaque calcifications to suggest nephro or ureterolithiasis. Mild multilevel degenerative changes throughout the spine.   Patchy opacities in the bilateral lung bases.  Incompletely imaged median sternotomy and surgical changes of prior CABG.  IMPRESSION:  1.  Nonobstructed bowel gas pattern.  No acute abdominal finding. 2.  Patchy opacities the bilateral lung bases may be related to expiratory phase timing.  If there is clinical concern, consider dedicated chest x-ray.   Original Report Authenticated By: Malachy Moan, M.D.      1. Constipation       MDM  This patient has chronic constipation. The abdominal x-ray as above supports his diagnosis. Is not having abdominal pain at this time and he is unable to reproduce it in the office. Is instructed to drink plenty of fluids and purchase some MiraLax to use as directed as a laxative. Increase fiber in your diet. Instructions on constipation, diet and abdominal pain.  Hayden Rasmussen, NP 08/04/12 1445

## 2012-08-04 NOTE — ED Notes (Signed)
Pt is here for abd pain, LUQ, x3 days Pain is intermittent and will increase w/deep breaths.  Sx include: constipation x1 week; will occasionally have a small bowel movement Last bowel movement was 1220 today Denies: f/v/n/d, swelling of abd, strenuous activity, urinary prob, hematuria Had Tyle yest night  He is alert w/no signs of acute distress w/steady gait

## 2012-08-04 NOTE — ED Provider Notes (Signed)
Medical screening examination/treatment/procedure(s) were performed by non-physician practitioner and as supervising physician I was immediately available for consultation/collaboration.  Leslee Home, M.D.   Reuben Likes, MD 08/04/12 [phone]

## 2012-08-08 ENCOUNTER — Encounter (HOSPITAL_BASED_OUTPATIENT_CLINIC_OR_DEPARTMENT_OTHER): Payer: Medicare Other | Attending: General Surgery

## 2012-08-08 DIAGNOSIS — E1169 Type 2 diabetes mellitus with other specified complication: Secondary | ICD-10-CM | POA: Insufficient documentation

## 2012-08-08 DIAGNOSIS — L97509 Non-pressure chronic ulcer of other part of unspecified foot with unspecified severity: Secondary | ICD-10-CM | POA: Insufficient documentation

## 2012-08-12 ENCOUNTER — Encounter (HOSPITAL_BASED_OUTPATIENT_CLINIC_OR_DEPARTMENT_OTHER): Payer: Medicare Other

## 2012-08-17 ENCOUNTER — Encounter (HOSPITAL_COMMUNITY): Payer: Medicare Other

## 2012-08-17 ENCOUNTER — Ambulatory Visit (HOSPITAL_COMMUNITY): Payer: Medicare Other

## 2012-08-19 ENCOUNTER — Encounter (HOSPITAL_COMMUNITY): Payer: Self-pay | Admitting: *Deleted

## 2012-08-19 ENCOUNTER — Emergency Department (HOSPITAL_COMMUNITY)
Admission: EM | Admit: 2012-08-19 | Discharge: 2012-08-19 | Disposition: A | Discharge status: Home / Self Care | Payer: Medicare Other | Source: Home / Self Care | Attending: Family Medicine | Admitting: Family Medicine

## 2012-08-19 DIAGNOSIS — J069 Acute upper respiratory infection, unspecified: Secondary | ICD-10-CM

## 2012-08-19 DIAGNOSIS — B009 Herpesviral infection, unspecified: Secondary | ICD-10-CM

## 2012-08-19 MED ORDER — VALACYCLOVIR HCL 1 G PO TABS: ORAL_TABLET | ORAL | Status: DC

## 2012-08-19 NOTE — ED Provider Notes (Signed)
History     CSN: [id number]  Arrival date & time 08/19/12  1256   First MD Initiated Contact with Patient 08/19/12 1313      Chief Complaint  Patient presents with  . URI  . Blister    (Consider location/radiation/quality/duration/timing/severity/associated sxs/prior treatment) Patient is a 72 y.o. male presenting with URI. The history is provided by the patient.  URI Presenting symptoms: congestion and rhinorrhea   Presenting symptoms: no cough, no fever and no sore throat   Severity:  Mild Progression:  Unchanged Chronicity:  New Associated symptoms comment:  Onset of fever blister on upper lip.   Past Medical History  Diagnosis Date  . Diabetes mellitus without complication   . Coronary artery disease   . High cholesterol     Past Surgical History  Procedure Laterality Date  . Coronary artery bypass graft      Family History  Problem Relation Age of Onset  . Family history unknown: Yes    History  Substance Use Topics  . Smoking status: Never Smoker   . Smokeless tobacco: Not on file  . Alcohol Use: No      Review of Systems  Constitutional: Negative.  Negative for fever.  HENT: Positive for nosebleeds, congestion, rhinorrhea and mouth sores. Negative for sore throat.   Respiratory: Negative for cough.   Skin: Positive for rash.    Allergies  Review of patient's allergies indicates no known allergies.  Home Medications   Current Outpatient Rx  Name  Route  Sig  Dispense  Refill  . diazepam (VALIUM) 5 MG tablet   Oral   Take 5 mg by mouth every 12 (twelve) hours as needed for anxiety.         . Cephalexin (KEFLEX PO)   Oral   Take by mouth.         . FENOFIBRATE MICRONIZED PO   Oral   Take by mouth.         . insulin aspart (NOVOLOG) 100 UNIT/ML injection   Subcutaneous   Inject into the skin 2 (two) times daily. 40 units am 30-40 units pm         . LISINOPRIL PO   Oral   Take by mouth.         Marland Kitchen SIMVASTATIN PO    Oral   Take by mouth.         . valACYclovir (VALTREX) 1000 MG tablet      2 tabs now and 2 in 12 hrs.   4 tablet   1     BP 136/85  Pulse 97  Temp(Src) 97.8 F (36.6 C) (Oral)  SpO2 98%  Physical Exam  Nursing note and vitals reviewed. Constitutional: He appears well-developed and well-nourished.  HENT:  Head: Normocephalic.  Right Ear: External ear normal.  Nose: Mucosal edema and rhinorrhea present. Epistaxis is observed.    Mouth/Throat: Oropharynx is clear and moist.    ED Course  Procedures (including critical care time)  Labs Reviewed - No data to display No results found.   1. URI (upper respiratory infection)   2. Herpetic lesion       MDM         Linna Hoff, MD 08/19/12 [phone]

## 2012-08-19 NOTE — ED Notes (Signed)
Pt reports uri/sinus infection since Tuesday when he was evaluated by personal md. Used nasal spray as prescribed and reports that it is now yellow tinged and streaked with red. Pt has developed fever blister (has never had before) and is using otc products with little relief. 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

## 2012-08-26 ENCOUNTER — Encounter (HOSPITAL_COMMUNITY): Payer: Self-pay

## 2012-08-26 ENCOUNTER — Emergency Department (HOSPITAL_COMMUNITY)
Admission: EM | Admit: 2012-08-26 | Discharge: 2012-08-26 | Disposition: A | Discharge status: Home / Self Care | Payer: Medicare Other | Source: Home / Self Care | Attending: Family Medicine | Admitting: Family Medicine

## 2012-08-26 DIAGNOSIS — T2122XA Burn of second degree of abdominal wall, initial encounter: Secondary | ICD-10-CM

## 2012-08-26 MED ORDER — SILVER SULFADIAZINE 1 % EX CREA: TOPICAL_CREAM | Freq: Every day | CUTANEOUS | Status: DC

## 2012-08-26 NOTE — ED Provider Notes (Signed)
History     CSN: [id number]  Arrival date & time 08/26/12  1242   First MD Initiated Contact with Patient 08/26/12 1303      Chief Complaint  Patient presents with  . Burn    (Consider location/radiation/quality/duration/timing/severity/associated sxs/prior treatment) HPI Comments: 72 year old diabetic male here complaining tender red area in his belly. Patient sustained a burn injury 2 days ago while he was preparing a meal and let to hot/voiding water run down over his abdomen and over his clothing. He states that he had a central blisters that ruptured. No weeping or drainage. Pain is minimal. Has been using triple antibiotic ointment over-the-counter.   Past Medical History  Diagnosis Date  . Diabetes mellitus without complication   . Coronary artery disease   . High cholesterol     Past Surgical History  Procedure Laterality Date  . Coronary artery bypass graft      History reviewed. No pertinent family history.  History  Substance Use Topics  . Smoking status: Never Smoker   . Smokeless tobacco: Not on file  . Alcohol Use: No      Review of Systems  Constitutional: Negative for fever and chills.  Skin:       Skin burn injury as per history of present illness.  All other systems reviewed and are negative.    Allergies  Review of patient's allergies indicates no known allergies.  Home Medications   Current Outpatient Rx  Name  Route  Sig  Dispense  Refill  . diazepam (VALIUM) 5 MG tablet   Oral   Take 5 mg by mouth every 12 (twelve) hours as needed for anxiety.         . FENOFIBRATE MICRONIZED PO   Oral   Take by mouth.         . insulin aspart (NOVOLOG) 100 UNIT/ML injection   Subcutaneous   Inject into the skin 2 (two) times daily. 40 units am 30-40 units pm         . LISINOPRIL PO   Oral   Take by mouth.         . silver sulfADIAZINE (SILVADENE) 1 % cream   Topical   Apply topically daily.   50 g   0   . SIMVASTATIN PO  Oral   Take by mouth.         . valACYclovir (VALTREX) 1000 MG tablet      2 tabs now and 2 in 12 hrs.   4 tablet   1     BP 140/94  Pulse 108  Temp(Src) 98.3 F (36.8 C) (Oral)  Resp 16  SpO2 95%  Physical Exam  Nursing note and vitals reviewed. Constitutional: He is oriented to person, place, and time. He appears well-developed and well-nourished. No distress.  Cardiovascular: Normal heart sounds.   Pulmonary/Chest: Breath sounds normal.  Neurological: He is alert and oriented to person, place, and time.  Skin: He is not diaphoretic.  There is a superficial first-degree burn extending vertically in the Center infraumbilical area about 2-3 cm wide and 10 cm long. There is a central area of of partial thickness about a size of quarter for a blister ruptured exposing a new pink raw skin. There is no exudates. Appears healing well with no signs of infection.    ED Course  Procedures (including critical care time)  Labs Reviewed - No data to display No results found.   1. Second degree burn of abdomen  MDM  Wound is healing properly with a small area about a quarter size status post ruptured blister with pink healthy new raw skin below without exudates or drainage.  Recommended to continue to use triple antibiotic or fill the prescription for Silvadene until completely healed. Supportive care and red flags that should prompt his return to medical attention discussed with patient and provided in writing.        Sharin Grave, MD 08/28/12 [phone]

## 2012-08-26 NOTE — ED Notes (Signed)
States he sustained burn to abdominal area 2 days ago while preparing meal; skin came off during medication application to burn

## 2012-08-29 ENCOUNTER — Emergency Department (HOSPITAL_COMMUNITY)
Admission: EM | Admit: 2012-08-29 | Discharge: 2012-08-29 | Disposition: A | Discharge status: Home / Self Care | Payer: Medicare Other | Source: Home / Self Care

## 2012-08-29 ENCOUNTER — Ambulatory Visit (HOSPITAL_COMMUNITY)
Admission: RE | Admit: 2012-08-29 | Discharge: 2012-08-29 | Disposition: A | Discharge status: Home / Self Care | Payer: Medicare Other | Source: Ambulatory Visit | Attending: Cardiovascular Disease | Admitting: Cardiovascular Disease

## 2012-08-29 ENCOUNTER — Ambulatory Visit (HOSPITAL_COMMUNITY)
Admission: RE | Admit: 2012-08-29 | Discharge: 2012-08-29 | Disposition: A | Discharge status: Home / Self Care | Payer: Medicare Other | Source: Ambulatory Visit | Attending: Family Medicine | Admitting: Family Medicine

## 2012-08-29 DIAGNOSIS — I079 Rheumatic tricuspid valve disease, unspecified: Secondary | ICD-10-CM | POA: Insufficient documentation

## 2012-08-29 DIAGNOSIS — I359 Nonrheumatic aortic valve disorder, unspecified: Secondary | ICD-10-CM | POA: Insufficient documentation

## 2012-08-29 DIAGNOSIS — I2510 Atherosclerotic heart disease of native coronary artery without angina pectoris: Secondary | ICD-10-CM | POA: Insufficient documentation

## 2012-08-29 DIAGNOSIS — E785 Hyperlipidemia, unspecified: Secondary | ICD-10-CM | POA: Insufficient documentation

## 2012-08-29 DIAGNOSIS — H349 Unspecified retinal vascular occlusion: Secondary | ICD-10-CM | POA: Insufficient documentation

## 2012-08-29 NOTE — Progress Notes (Signed)
Carotid Duplex Complete Mechille Varghese 

## 2012-08-29 NOTE — Progress Notes (Signed)
2D Echo Performed 08/29/2012    Mang Hazelrigg, RCS  

## 2012-09-05 ENCOUNTER — Encounter (HOSPITAL_BASED_OUTPATIENT_CLINIC_OR_DEPARTMENT_OTHER): Payer: Medicare Other | Attending: General Surgery

## 2012-09-05 DIAGNOSIS — E1169 Type 2 diabetes mellitus with other specified complication: Secondary | ICD-10-CM | POA: Insufficient documentation

## 2012-09-05 DIAGNOSIS — L97509 Non-pressure chronic ulcer of other part of unspecified foot with unspecified severity: Secondary | ICD-10-CM | POA: Insufficient documentation

## 2012-10-05 ENCOUNTER — Encounter (HOSPITAL_BASED_OUTPATIENT_CLINIC_OR_DEPARTMENT_OTHER): Payer: Medicare Other | Attending: General Surgery

## 2012-10-05 DIAGNOSIS — L97509 Non-pressure chronic ulcer of other part of unspecified foot with unspecified severity: Secondary | ICD-10-CM | POA: Insufficient documentation

## 2012-10-05 DIAGNOSIS — E1169 Type 2 diabetes mellitus with other specified complication: Secondary | ICD-10-CM | POA: Insufficient documentation

## 2012-10-14 ENCOUNTER — Other Ambulatory Visit (HOSPITAL_COMMUNITY): Payer: Self-pay | Admitting: Cardiovascular Disease

## 2012-10-14 DIAGNOSIS — I739 Peripheral vascular disease, unspecified: Secondary | ICD-10-CM

## 2012-10-17 ENCOUNTER — Encounter (HOSPITAL_COMMUNITY): Payer: Self-pay | Admitting: Emergency Medicine

## 2012-10-17 ENCOUNTER — Emergency Department (INDEPENDENT_AMBULATORY_CARE_PROVIDER_SITE_OTHER)
Admission: EM | Admit: 2012-10-17 | Discharge: 2012-10-17 | Disposition: A | Discharge status: Home / Self Care | Payer: Medicare Other | Source: Home / Self Care | Attending: Family Medicine | Admitting: Family Medicine

## 2012-10-17 DIAGNOSIS — K649 Unspecified hemorrhoids: Secondary | ICD-10-CM

## 2012-10-17 DIAGNOSIS — K5900 Constipation, unspecified: Secondary | ICD-10-CM

## 2012-10-17 NOTE — ED Notes (Signed)
Pt is here c/o 2 episode of rectal bleeding onset Sunday States he bought OTC enema and used it yest to alleviate the constipation; first bowel movent yest contained blood Since Sunday, he has had 4 bowel movements today w/no signs of blood Hx of constipation and hemorrhoids  Denies: pain, f/v/n/d  He is alert and oriented w/no signs of acute distress.

## 2012-10-17 NOTE — ED Provider Notes (Signed)
History     CSN: [id number]  Arrival date & time 10/17/12  1329   First MD Initiated Contact with Patient 10/17/12 1436      Chief Complaint  Patient presents with  . Rectal Bleeding    (Consider location/radiation/quality/duration/timing/severity/associated sxs/prior treatment) HPI Comments: Pt reports long standing problems with constipation.  Felt he needed to have bowel movement yesterday but couldn't so used an enema.  Noted small amount bright red blood with removal of enema.  2 hard, difficult to pass stools yesterday, both episodes noted blood on tissue paper after wiping.  Today 4 bowel movements, the last more "normal" and easy to pass.  No blood noted today.  Denies dark tarry stools.   Patient is a 72 y.o. male presenting with hematochezia. The history is provided by the patient.  Rectal Bleeding  The current episode started yesterday. The onset was sudden. The problem occurs rarely. The problem has been resolved. The patient is experiencing no pain. The stool is described as hard. Associated symptoms include hemorrhoids. Pertinent negatives include no fever, no abdominal pain, no diarrhea, no nausea, no rectal pain and no vomiting.    Past Medical History  Diagnosis Date  . Diabetes mellitus without complication   . Coronary artery disease   . High cholesterol     Past Surgical History  Procedure Laterality Date  . Coronary artery bypass graft      History reviewed. No pertinent family history.  History  Substance Use Topics  . Smoking status: Never Smoker   . Smokeless tobacco: Not on file  . Alcohol Use: No      Review of Systems  Constitutional: Negative for fever and chills.  Gastrointestinal: Positive for constipation, hematochezia and hemorrhoids. Negative for nausea, vomiting, abdominal pain, diarrhea, blood in stool and rectal pain.    Allergies  Review of patient's allergies indicates no known allergies.  Home Medications   Current  Outpatient Rx  Name  Route  Sig  Dispense  Refill  . diazepam (VALIUM) 5 MG tablet   Oral   Take 5 mg by mouth every 12 (twelve) hours as needed for anxiety.         . FENOFIBRATE MICRONIZED PO   Oral   Take by mouth.         . insulin aspart (NOVOLOG) 100 UNIT/ML injection   Subcutaneous   Inject into the skin 2 (two) times daily. 40 units am 30-40 units pm         . LISINOPRIL PO   Oral   Take by mouth.         . silver sulfADIAZINE (SILVADENE) 1 % cream   Topical   Apply topically daily.   50 g   0   . SIMVASTATIN PO   Oral   Take by mouth.         . valACYclovir (VALTREX) 1000 MG tablet      2 tabs now and 2 in 12 hrs.   4 tablet   1     BP 142/61  Pulse 86  Temp(Src) 97.7 F (36.5 C) (Oral)  Resp 20  SpO2 98%  Physical Exam  Constitutional: He appears well-developed and well-nourished. No distress.  Cardiovascular: Normal rate and regular rhythm.   Pulmonary/Chest: Effort normal and breath sounds normal.  Genitourinary: Rectal exam shows external hemorrhoid. Rectal exam shows no fissure, no mass, no tenderness and anal tone normal.  No stool in rectum, no blood in rectum. Hemorrhoids are external,  soft and flat.      ED Course  Procedures (including critical care time)  Labs Reviewed - No data to display No results found.   1. Constipation   2. Hemorrhoids       MDM  Bleeding noted yesterday likely related to constipation and mild trauma from enema.  Pt to continue to work on increasing fiber and water in diet, avoid enemas.         Cathlyn Parsons, NP 10/17/12 [phone]

## 2012-10-17 NOTE — ED Provider Notes (Signed)
Medical screening examination/treatment/procedure(s) were performed by resident physician or non-physician practitioner and as supervising physician I was immediately available for consultation/collaboration.   Karlisa Gaubert DOUGLAS MD.   Brandonlee Navis D Dayvion Sans, MD 10/17/12 1838 

## 2012-11-07 ENCOUNTER — Ambulatory Visit (HOSPITAL_COMMUNITY)
Admission: RE | Admit: 2012-11-07 | Discharge: 2012-11-07 | Disposition: A | Discharge status: Home / Self Care | Payer: Medicare Other | Source: Ambulatory Visit | Attending: Cardiovascular Disease | Admitting: Cardiovascular Disease

## 2012-11-07 ENCOUNTER — Other Ambulatory Visit (HOSPITAL_COMMUNITY): Payer: Self-pay | Admitting: *Deleted

## 2012-11-07 DIAGNOSIS — I739 Peripheral vascular disease, unspecified: Secondary | ICD-10-CM | POA: Insufficient documentation

## 2012-11-07 NOTE — Progress Notes (Signed)
ABIs and lower extremity duplex doppler was completed. Aariana Shankland RVT 

## 2012-11-23 ENCOUNTER — Telehealth: Payer: Self-pay | Admitting: Cardiovascular Disease

## 2012-11-23 DIAGNOSIS — I739 Peripheral vascular disease, unspecified: Secondary | ICD-10-CM

## 2012-11-23 NOTE — Telephone Encounter (Signed)
Message copied by Marella Bile on Wed Nov 23, 2012  1:22 PM ------      Message from: Runell Gess      Created: Thu Nov 17, 2012  4:57 PM       Mildly abn, repeat in 6 months ------

## 2012-11-23 NOTE — Telephone Encounter (Signed)
Returning your call. °

## 2012-12-17 ENCOUNTER — Emergency Department (INDEPENDENT_AMBULATORY_CARE_PROVIDER_SITE_OTHER)
Admission: EM | Admit: 2012-12-17 | Discharge: 2012-12-17 | Disposition: A | Discharge status: Home / Self Care | Payer: Medicare Other | Source: Home / Self Care

## 2012-12-17 ENCOUNTER — Encounter (HOSPITAL_COMMUNITY): Payer: Self-pay | Admitting: Emergency Medicine

## 2012-12-17 DIAGNOSIS — K5900 Constipation, unspecified: Secondary | ICD-10-CM

## 2012-12-17 HISTORY — DX: Anxiety disorder, unspecified: F41.9

## 2012-12-17 MED ORDER — DOCUSATE SODIUM 100 MG PO CAPS: 200.0000 mg | ORAL_CAPSULE | Freq: Two times a day (BID) | ORAL | Status: DC

## 2012-12-17 MED ORDER — POLYETHYLENE GLYCOL 3350 17 G PO PACK: 17.0000 g | PACK | Freq: Every day | ORAL | Status: DC

## 2012-12-17 NOTE — ED Notes (Signed)
Pt c/o constipation... Seen here at [facility] back in 4/14 for the same reason... Has had 2 bowel movements today but w/difficutly... abd pain/pressure Also c/o stress factors that contribute to constipation and also taking valium... He is alert and oriented w/no signs of acute distress.

## 2012-12-17 NOTE — ED Provider Notes (Signed)
History     CSN: [id number]  Arrival date & time 12/17/12  1344   First MD Initiated Contact with Patient 12/17/12 1455      Chief Complaint  Patient presents with  . Constipation    (Consider location/radiation/quality/duration/timing/severity/associated sxs/prior treatment) Patient is a 72 y.o. male presenting with constipation.  Constipation Severity:  No pain Time since last bowel movement:  1 day Timing:  Intermittent Chronicity:  Recurrent Context: stress   Unusual stool frequency:  For the last 10 days he has had constiatipation.  taken prune juice with some help but having small hard bm. Associated symptoms: flatus   Associated symptoms: no abdominal pain, no anorexia, no back pain, no diarrhea, no dysuria, no fever, no hematochezia, no nausea, no urinary retention and no vomiting     Past Medical History  Diagnosis Date  . Diabetes mellitus without complication   . Coronary artery disease   . High cholesterol   . Anxiety     Past Surgical History  Procedure Laterality Date  . Coronary artery bypass graft      No family history on file.  History  Substance Use Topics  . Smoking status: Never Smoker   . Smokeless tobacco: Not on file  . Alcohol Use: No      Review of Systems  Unable to perform ROS Constitutional: Negative for fever.  Gastrointestinal: Positive for constipation and flatus. Negative for nausea, vomiting, abdominal pain, diarrhea, hematochezia and anorexia.  Genitourinary: Negative for dysuria.  Musculoskeletal: Negative for back pain.  All other systems reviewed and are negative.    Allergies  Review of patient's allergies indicates no known allergies.  Home Medications   Current Outpatient Rx  Name  Route  Sig  Dispense  Refill  . diazepam (VALIUM) 5 MG tablet   Oral   Take 5 mg by mouth every 12 (twelve) hours as needed for anxiety.         . docusate sodium (COLACE) 100 MG capsule   Oral   Take 2 capsules (200 mg  total) by mouth every 12 (twelve) hours.   60 capsule   0   . FENOFIBRATE MICRONIZED PO   Oral   Take by mouth.         . insulin aspart (NOVOLOG) 100 UNIT/ML injection   Subcutaneous   Inject into the skin 2 (two) times daily. 40 units am 30-40 units pm         . LISINOPRIL PO   Oral   Take by mouth.         . polyethylene glycol (MIRALAX) packet   Oral   Take 17 g by mouth daily.   14 each   0   . silver sulfADIAZINE (SILVADENE) 1 % cream   Topical   Apply topically daily.   50 g   0   . SIMVASTATIN PO   Oral   Take by mouth.         . valACYclovir (VALTREX) 1000 MG tablet      2 tabs now and 2 in 12 hrs.   4 tablet   1     BP 124/75  Pulse 93  Temp(Src) 98.6 F (37 C) (Oral)  Resp 20  SpO2 96%  Physical Exam  Nursing note and vitals reviewed. Constitutional: He is oriented to person, place, and time. He appears well-developed and well-nourished.  HENT:  Head: Normocephalic and atraumatic.  Eyes: Conjunctivae and EOM are normal. Pupils are equal, round, and  reactive to light.  Neck: Normal range of motion. Neck supple.  Cardiovascular: Normal rate, regular rhythm, normal heart sounds and intact distal pulses.   Abdominal: Soft. Bowel sounds are normal. He exhibits no distension and no mass. There is no tenderness. There is no rebound and no guarding.  Musculoskeletal: Normal range of motion.  Neurological: He is alert and oriented to person, place, and time.    ED Course  Procedures (including critical care time)  Labs Reviewed - No data to display No results found.   1. Constipation       MDM  Discussed diet and exercise strategies.  Given meds and instructions - see orderse. SE and precautiosn reviewed.        Maryelizabeth Rowan, MD 12/17/12 [phone]

## 2012-12-24 ENCOUNTER — Emergency Department (HOSPITAL_COMMUNITY)
Admission: EM | Admit: 2012-12-24 | Discharge: 2012-12-24 | Disposition: A | Discharge status: Home / Self Care | Payer: Medicare Other | Attending: Emergency Medicine | Admitting: Emergency Medicine

## 2012-12-24 ENCOUNTER — Encounter (HOSPITAL_COMMUNITY): Payer: Self-pay | Admitting: Emergency Medicine

## 2012-12-24 ENCOUNTER — Emergency Department (HOSPITAL_COMMUNITY): Payer: Medicare Other

## 2012-12-24 DIAGNOSIS — K5900 Constipation, unspecified: Secondary | ICD-10-CM | POA: Insufficient documentation

## 2012-12-24 DIAGNOSIS — F411 Generalized anxiety disorder: Secondary | ICD-10-CM | POA: Insufficient documentation

## 2012-12-24 DIAGNOSIS — E119 Type 2 diabetes mellitus without complications: Secondary | ICD-10-CM | POA: Insufficient documentation

## 2012-12-24 DIAGNOSIS — Z794 Long term (current) use of insulin: Secondary | ICD-10-CM | POA: Insufficient documentation

## 2012-12-24 DIAGNOSIS — Z79899 Other long term (current) drug therapy: Secondary | ICD-10-CM | POA: Insufficient documentation

## 2012-12-24 DIAGNOSIS — I2510 Atherosclerotic heart disease of native coronary artery without angina pectoris: Secondary | ICD-10-CM | POA: Insufficient documentation

## 2012-12-24 DIAGNOSIS — Z951 Presence of aortocoronary bypass graft: Secondary | ICD-10-CM | POA: Insufficient documentation

## 2012-12-24 DIAGNOSIS — E7800 Pure hypercholesterolemia, unspecified: Secondary | ICD-10-CM | POA: Insufficient documentation

## 2012-12-24 MED ORDER — FLEET ENEMA 7-19 GM/118ML RE ENEM
1.0000 | ENEMA | Freq: Once | RECTAL | Status: AC
  Administered 2012-12-24: 1 via RECTAL
  Filled 2012-12-24: qty 1

## 2012-12-24 MED ORDER — POLYETHYLENE GLYCOL 3350 17 GM/SCOOP PO POWD: 17.0000 g | Freq: Two times a day (BID) | ORAL | Status: DC

## 2012-12-24 NOTE — ED Notes (Addendum)
Pt reports constipation since Tuesday. Pt has tried miralax and suppository without any relief. Pt seen at Urgent care for same recently. Pt describes stool as "Thin." Contributes symptoms to "nerves."

## 2012-12-24 NOTE — ED Notes (Signed)
Patient transported to X-ray 

## 2012-12-24 NOTE — ED Provider Notes (Signed)
History     CSN: [id number]  Arrival date & time 12/24/12  1325   First MD Initiated Contact with Patient 12/24/12 1341      Chief Complaint  Patient presents with  . Constipation    (Consider location/radiation/quality/duration/timing/severity/associated sxs/prior treatment) HPI Comments: Patient presents to the emergency department with chief complaint of constipation. He's been constipated for several weeks now. He reports that he has been seen by urgent care for the same, and was prescribed Colace. He states that he has taken it with some relief. However, he states that he read on the label that if he noticed "thin loose stools",  that he should return to the emergency department. He states that he has been having bowel movements, but that they have been "skinny." He denies any fevers, chills, nausea, vomiting, or diarrhea. He states that he has not had a bowel movement today, but he has been moving stool more effectively since using Colace. He denies any abdominal pain. He states that he suffers from anxiety and gets very nervous.  The history is provided by the patient. No language interpreter was used.    Past Medical History  Diagnosis Date  . Diabetes mellitus without complication   . Coronary artery disease   . High cholesterol   . Anxiety     Past Surgical History  Procedure Laterality Date  . Coronary artery bypass graft      No family history on file.  History  Substance Use Topics  . Smoking status: Never Smoker   . Smokeless tobacco: Not on file  . Alcohol Use: No      Review of Systems  All other systems reviewed and are negative.    Allergies  Review of patient's allergies indicates no known allergies.  Home Medications   Current Outpatient Rx  Name  Route  Sig  Dispense  Refill  . diazepam (VALIUM) 5 MG tablet   Oral   Take 5 mg by mouth every 12 (twelve) hours as needed for anxiety.         . docusate sodium (COLACE) 100 MG capsule  Oral   Take 2 capsules (200 mg total) by mouth every 12 (twelve) hours.   60 capsule   0   . FENOFIBRATE MICRONIZED PO   Oral   Take by mouth.         . insulin aspart (NOVOLOG) 100 UNIT/ML injection   Subcutaneous   Inject into the skin 2 (two) times daily. 40 units am 30-40 units pm         . LISINOPRIL PO   Oral   Take by mouth.         . polyethylene glycol (MIRALAX) packet   Oral   Take 17 g by mouth daily.   14 each   0   . silver sulfADIAZINE (SILVADENE) 1 % cream   Topical   Apply topically daily.   50 g   0   . SIMVASTATIN PO   Oral   Take by mouth.         . zolpidem (AMBIEN) 10 MG tablet   Oral   Take 10 mg by mouth at bedtime as needed for sleep.           BP 132/69  Pulse 82  Resp 17  SpO2 98%  Physical Exam  Nursing note and vitals reviewed. Constitutional: He is oriented to person, place, and time. He appears well-developed and well-nourished.  HENT:  Head: Normocephalic and  atraumatic.  Eyes: Conjunctivae and EOM are normal. Pupils are equal, round, and reactive to light. Right eye exhibits no discharge. Left eye exhibits no discharge. No scleral icterus.  Neck: Normal range of motion. Neck supple. No JVD present.  Cardiovascular: Normal rate, regular rhythm, normal heart sounds and intact distal pulses.  Exam reveals no gallop and no friction rub.   No murmur heard. Pulmonary/Chest: Effort normal and breath sounds normal. No respiratory distress. He has no wheezes. He has no rales. He exhibits no tenderness.  Abdominal: Soft. Bowel sounds are normal. He exhibits no distension and no mass. There is no tenderness. There is no rebound and no guarding.  No focal abdominal tenderness, no signs of surgical or acute abdomen  Genitourinary: Rectum normal.  No stool in the rectal vault, no hard stool ball felt, no gross blood, no hemorrhoids, or obvious fissures.  Musculoskeletal: Normal range of motion. He exhibits no edema and no  tenderness.  Neurological: He is alert and oriented to person, place, and time. He has normal reflexes.  CN 3-12 intact  Skin: Skin is warm and dry.  Psychiatric: He has a normal mood and affect. His behavior is normal. Judgment and thought content normal.    ED Course  Procedures (including critical care time)  Results for orders placed during the hospital encounter of 08/04/12  POCT URINALYSIS DIP (DEVICE)      Result Value Range   Glucose, UA NEGATIVE  NEGATIVE mg/dL   Bilirubin Urine NEGATIVE  NEGATIVE   Ketones, ur NEGATIVE  NEGATIVE mg/dL   Specific Gravity, Urine 1.015  1.005 - 1.030   Hgb urine dipstick NEGATIVE  NEGATIVE   pH 6.0  5.0 - 8.0   Protein, ur NEGATIVE  NEGATIVE mg/dL   Urobilinogen, UA 0.2  0.0 - 1.0 mg/dL   Nitrite NEGATIVE  NEGATIVE   Leukocytes, UA NEGATIVE  NEGATIVE   Dg Abd Acute W/chest  12/24/2012   *RADIOLOGY REPORT*  Clinical Data: Constipation.  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 There is mild cardiomegaly.  Abdominal films demonstrate a moderate amount of fecal material throughout much of the colon.  There is no evidence of bowel obstruction or free intraperitoneal air.  No abnormal calcifications are seen.  Degenerative changes are present in the lower lumbar spine.  IMPRESSION: Moderate fecal material in the colon without evidence of bowel obstruction.   Original Report Authenticated By: Irish Lack, M.D.      1. Constipation       MDM  Patient complaining of constipation. However, he states that he has been having bowel movements since starting Colace. Denies any blood in the stool. Denies any abdominal pain. Denies fever, nausea, or vomiting. He states that he is concerned because he was having "skinny stools." Rectal exam is normal. No hard stool ball is felt.  Will give Fleet enema and discharge to home.  PCP follow up.  Miralax for  home.  2:50 PM Patient discussed with Dr. Silverio Lay, who agrees with the plan.        Roxy Horseman, PA-C 12/24/12 1548

## 2012-12-25 NOTE — ED Provider Notes (Signed)
Medical screening examination/treatment/procedure(s) were performed by non-physician practitioner and as supervising physician I was immediately available for consultation/collaboration.   Shiane Wenberg H Anjanette Gilkey, MD 12/25/12 0659 

## 2013-02-02 ENCOUNTER — Encounter (HOSPITAL_COMMUNITY): Payer: Self-pay | Admitting: Adult Health

## 2013-02-02 ENCOUNTER — Emergency Department (HOSPITAL_COMMUNITY)
Admission: EM | Admit: 2013-02-02 | Discharge: 2013-02-02 | Disposition: A | Discharge status: Home / Self Care | Payer: Medicare Other | Attending: Emergency Medicine | Admitting: Emergency Medicine

## 2013-02-02 DIAGNOSIS — W010XXA Fall on same level from slipping, tripping and stumbling without subsequent striking against object, initial encounter: Secondary | ICD-10-CM | POA: Insufficient documentation

## 2013-02-02 DIAGNOSIS — IMO0002 Reserved for concepts with insufficient information to code with codable children: Secondary | ICD-10-CM | POA: Insufficient documentation

## 2013-02-02 DIAGNOSIS — F411 Generalized anxiety disorder: Secondary | ICD-10-CM | POA: Insufficient documentation

## 2013-02-02 DIAGNOSIS — Z794 Long term (current) use of insulin: Secondary | ICD-10-CM | POA: Insufficient documentation

## 2013-02-02 DIAGNOSIS — Z79899 Other long term (current) drug therapy: Secondary | ICD-10-CM | POA: Insufficient documentation

## 2013-02-02 DIAGNOSIS — Y9289 Other specified places as the place of occurrence of the external cause: Secondary | ICD-10-CM | POA: Insufficient documentation

## 2013-02-02 DIAGNOSIS — E7800 Pure hypercholesterolemia, unspecified: Secondary | ICD-10-CM | POA: Insufficient documentation

## 2013-02-02 DIAGNOSIS — Z23 Encounter for immunization: Secondary | ICD-10-CM | POA: Insufficient documentation

## 2013-02-02 DIAGNOSIS — Z951 Presence of aortocoronary bypass graft: Secondary | ICD-10-CM | POA: Insufficient documentation

## 2013-02-02 DIAGNOSIS — I2510 Atherosclerotic heart disease of native coronary artery without angina pectoris: Secondary | ICD-10-CM | POA: Insufficient documentation

## 2013-02-02 DIAGNOSIS — E119 Type 2 diabetes mellitus without complications: Secondary | ICD-10-CM | POA: Insufficient documentation

## 2013-02-02 DIAGNOSIS — Y9302 Activity, running: Secondary | ICD-10-CM | POA: Insufficient documentation

## 2013-02-02 DIAGNOSIS — T148XXA Other injury of unspecified body region, initial encounter: Secondary | ICD-10-CM

## 2013-02-02 MED ORDER — TETANUS-DIPHTH-ACELL PERTUSSIS 5-2.5-18.5 LF-MCG/0.5 IM SUSP
0.5000 mL | Freq: Once | INTRAMUSCULAR | Status: AC
  Administered 2013-02-02: 0.5 mL via INTRAMUSCULAR
  Filled 2013-02-02: qty 0.5

## 2013-02-02 MED ORDER — CEPHALEXIN 500 MG PO CAPS: 500.0000 mg | ORAL_CAPSULE | Freq: Two times a day (BID) | ORAL | Status: DC

## 2013-02-02 MED ORDER — CEPHALEXIN 250 MG PO CAPS
500.0000 mg | ORAL_CAPSULE | Freq: Once | ORAL | Status: AC
  Administered 2013-02-02: 500 mg via ORAL
  Filled 2013-02-02: qty 2

## 2013-02-02 MED ORDER — HYDROCODONE-ACETAMINOPHEN 5-325 MG PO TABS: ORAL_TABLET | ORAL | Status: DC

## 2013-02-02 MED ORDER — HYDROCODONE-ACETAMINOPHEN 5-325 MG PO TABS
1.0000 | ORAL_TABLET | Freq: Once | ORAL | Status: AC
  Administered 2013-02-02: 1 via ORAL
  Filled 2013-02-02: qty 1

## 2013-02-02 NOTE — ED Notes (Signed)
PA at bedside.

## 2013-02-02 NOTE — ED Provider Notes (Signed)
Medical screening examination/treatment/procedure(s) were performed by non-physician practitioner and as supervising physician I was immediately available for consultation/collaboration.  Doug Sou, MD 02/02/13 2348
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
## 2013-02-02 NOTE — ED Notes (Signed)
Reports tripping and falling in gravel parking lot and lnading on left knee, scrapes noted to left knee. Pt requesting a pain pill to help him sleep at night for pain.

## 2013-02-04 ENCOUNTER — Emergency Department (HOSPITAL_COMMUNITY)
Admission: EM | Admit: 2013-02-04 | Discharge: 2013-02-04 | Disposition: A | Discharge status: Home / Self Care | Payer: Medicare Other | Source: Home / Self Care

## 2013-02-04 ENCOUNTER — Encounter (HOSPITAL_COMMUNITY): Payer: Self-pay | Admitting: Emergency Medicine

## 2013-02-04 DIAGNOSIS — IMO0002 Reserved for concepts with insufficient information to code with codable children: Secondary | ICD-10-CM

## 2013-02-04 NOTE — ED Provider Notes (Signed)
CSN: [id number]     Arrival date & time 02/04/13  1252 History     None    Chief Complaint  Patient presents with  . Follow-up   (Consider location/radiation/quality/duration/timing/severity/associated sxs/prior Treatment) HPI  72 yo wm comes in for f/u of left anterior knee abrasion.  Was just seen for this 31 July at the [facility].  Says that came in to make sure that his leg is healing and doesn't have an infection.  Was given keflex bid.  No other issues.   Past Medical History  Diagnosis Date  . Diabetes mellitus without complication   . Coronary artery disease   . High cholesterol   . Anxiety    Past Surgical History  Procedure Laterality Date  . Coronary artery bypass graft     No family history on file. History  Substance Use Topics  . Smoking status: Never Smoker   . Smokeless tobacco: Not on file  . Alcohol Use: No    Review of Systems  Constitutional: Negative for fever and chills.  Skin: Positive for wound.    Allergies  Review of patient's allergies indicates no known allergies.  Home Medications   Current Outpatient Rx  Name  Route  Sig  Dispense  Refill  . cephALEXin (KEFLEX) 500 MG capsule   Oral   Take 1 capsule (500 mg total) by mouth 2 (two) times daily.   20 capsule   0   . diazepam (VALIUM) 5 MG tablet   Oral   Take 5 mg by mouth every 12 (twelve) hours as needed for anxiety. For anxiety         . FENOFIBRATE MICRONIZED PO   Oral   Take 1 tablet by mouth every morning.          Marland Kitchen HYDROcodone-acetaminophen (NORCO/VICODIN) 5-325 MG per tablet      Take 1-2 tablets by mouth every 6 hours as needed for pain.   15 tablet   0   . insulin aspart protamine- aspart (NOVOLOG MIX 70/30) (70-30) 100 UNIT/ML injection   Subcutaneous   Inject 40 Units into the skin 2 (two) times daily with a meal.         . LISINOPRIL PO   Oral   Take 1 tablet by mouth daily as needed. For hypertension         . Melatonin 5 MG TABS   Oral   Take  2-4 tablets by mouth at bedtime as needed. For sleep         . polyethylene glycol powder (GLYCOLAX/MIRALAX) powder   Oral   Take 17 g by mouth every other day.         Marland Kitchen SIMVASTATIN PO   Oral   Take 1 tablet by mouth daily.          Marland Kitchen zolpidem (AMBIEN) 10 MG tablet   Oral   Take 10 mg by mouth at bedtime as needed for sleep.          BP 136/106  Pulse 89  Temp(Src) 98 F (36.7 C) (Oral)  Resp 16  SpO2 100% Physical Exam  Constitutional: He appears well-developed and well-nourished.  HENT:  Head: Normocephalic and atraumatic.  Eyes: EOM are normal. Pupils are equal, round, and reactive to light.  Pulmonary/Chest: Effort normal.  Musculoskeletal:  Large abrasion left anterior knee obviously not healed. No gross signs of infection.  Good knee rom.    Skin: Skin is warm and dry.  Psychiatric: He  has a normal mood and affect.    ED Course   Procedures (including critical care time)  Labs Reviewed - No data to display No results found. 1. Abrasion, leg w/o infection     MDM  Finish keflex.  F/u in 1-2 weeks with primary care if not healed. All questions answered.    Zonia Kief, PA-C 02/04/13 1505  Zonia Kief, PA-C 02/05/13 1357

## 2013-02-04 NOTE — ED Notes (Signed)
Pt is here for a f/u on left knee abrasion... Reports he was seen on 7/31 at [facility] ER for a fall on left knee; fell onto pavement; was told to f/u here at the [facility] if he had any concerns... Pt is just wanting to know if its healing fine... He is alert w/no signs of acute distress.

## 2013-02-06 NOTE — ED Provider Notes (Signed)
Medical screening examination/treatment/procedure(s) were performed by resident physician or non-physician practitioner and as supervising physician I was immediately available for consultation/collaboration.   Barkley Bruns MD.   Linna Hoff, MD 02/06/13 [phone]

## 2013-04-06 ENCOUNTER — Telehealth (HOSPITAL_COMMUNITY): Payer: Self-pay | Admitting: *Deleted

## 2013-04-18 ENCOUNTER — Telehealth (HOSPITAL_COMMUNITY): Payer: Self-pay | Admitting: *Deleted

## 2013-05-12 ENCOUNTER — Encounter (HOSPITAL_BASED_OUTPATIENT_CLINIC_OR_DEPARTMENT_OTHER): Payer: Medicare Other | Attending: General Surgery

## 2013-05-12 DIAGNOSIS — I2510 Atherosclerotic heart disease of native coronary artery without angina pectoris: Secondary | ICD-10-CM | POA: Insufficient documentation

## 2013-05-12 DIAGNOSIS — I10 Essential (primary) hypertension: Secondary | ICD-10-CM | POA: Insufficient documentation

## 2013-05-12 DIAGNOSIS — Z951 Presence of aortocoronary bypass graft: Secondary | ICD-10-CM | POA: Insufficient documentation

## 2013-05-12 DIAGNOSIS — E1169 Type 2 diabetes mellitus with other specified complication: Secondary | ICD-10-CM | POA: Insufficient documentation

## 2013-05-12 DIAGNOSIS — L97509 Non-pressure chronic ulcer of other part of unspecified foot with unspecified severity: Secondary | ICD-10-CM | POA: Insufficient documentation
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
## 2013-05-29 ENCOUNTER — Emergency Department (HOSPITAL_COMMUNITY)
Admission: EM | Admit: 2013-05-29 | Discharge: 2013-05-29 | Disposition: A | Discharge status: Home / Self Care | Payer: Medicare Other | Attending: Emergency Medicine | Admitting: Emergency Medicine

## 2013-05-29 ENCOUNTER — Encounter (HOSPITAL_COMMUNITY): Payer: Self-pay | Admitting: Emergency Medicine

## 2013-05-29 DIAGNOSIS — S30810A Abrasion of lower back and pelvis, initial encounter: Secondary | ICD-10-CM

## 2013-05-29 DIAGNOSIS — I2510 Atherosclerotic heart disease of native coronary artery without angina pectoris: Secondary | ICD-10-CM | POA: Insufficient documentation

## 2013-05-29 DIAGNOSIS — Y92009 Unspecified place in unspecified non-institutional (private) residence as the place of occurrence of the external cause: Secondary | ICD-10-CM | POA: Insufficient documentation

## 2013-05-29 DIAGNOSIS — Z87891 Personal history of nicotine dependence: Secondary | ICD-10-CM | POA: Insufficient documentation

## 2013-05-29 DIAGNOSIS — E119 Type 2 diabetes mellitus without complications: Secondary | ICD-10-CM | POA: Insufficient documentation

## 2013-05-29 DIAGNOSIS — Y9301 Activity, walking, marching and hiking: Secondary | ICD-10-CM | POA: Insufficient documentation

## 2013-05-29 DIAGNOSIS — W010XXA Fall on same level from slipping, tripping and stumbling without subsequent striking against object, initial encounter: Secondary | ICD-10-CM | POA: Insufficient documentation

## 2013-05-29 DIAGNOSIS — Z794 Long term (current) use of insulin: Secondary | ICD-10-CM | POA: Insufficient documentation

## 2013-05-29 DIAGNOSIS — F411 Generalized anxiety disorder: Secondary | ICD-10-CM | POA: Insufficient documentation

## 2013-05-29 DIAGNOSIS — E7800 Pure hypercholesterolemia, unspecified: Secondary | ICD-10-CM | POA: Insufficient documentation

## 2013-05-29 DIAGNOSIS — IMO0002 Reserved for concepts with insufficient information to code with codable children: Secondary | ICD-10-CM | POA: Insufficient documentation

## 2013-05-29 DIAGNOSIS — Z7982 Long term (current) use of aspirin: Secondary | ICD-10-CM | POA: Insufficient documentation

## 2013-05-29 DIAGNOSIS — Z79899 Other long term (current) drug therapy: Secondary | ICD-10-CM | POA: Insufficient documentation

## 2013-05-29 MED ORDER — BACITRACIN ZINC 500 UNIT/GM EX OINT: 1.0000 "application " | TOPICAL_OINTMENT | Freq: Two times a day (BID) | CUTANEOUS | Status: DC

## 2013-05-29 NOTE — ED Provider Notes (Signed)
CSN: [id number]     Arrival date & time 05/29/13  1924 History  This chart was scribed for Trixie Dredge, PA-C, working with Shon Baton, MD by Blanchard Kelch, ED Scribe. This patient was seen in room TR04C/TR04C and the patient's care was started at 9:34 PM.    Chief Complaint  Patient presents with  . Abrasion    The history is provided by the patient. No language interpreter was used.    HPI Comments: [name] is a 72 y.o. male who presents to the Emergency Department complaining of a laceration that occurred four nights ago. He got up from bed that night, stumbled and got his right buttocks scratched on a telephone table. He was seen by a physician (Dr. Jimmey Ralph) at the [facility] for a different problem in his foot and was told the laceration was healing well. He came to get it checked out to make sure it is not infected. The area is not actively bleeding. He denies any pain currently. He has been putting Neosporin on the laceration with relief. He denies any dizziness, lightheadedness, chest pain, SOB, syncope, or fever. He has a past medical history of diabetes but reports that his glucose levels have been controlled.   Past Medical History  Diagnosis Date  . Diabetes mellitus without complication   . Coronary artery disease   . High cholesterol   . Anxiety    Past Surgical History  Procedure Laterality Date  . Coronary artery bypass graft     History reviewed. No pertinent family history. History  Substance Use Topics  . Smoking status: Former Games developer  . Smokeless tobacco: Never Used  . Alcohol Use: No    Review of Systems  Constitutional: Negative for fever.  Respiratory: Negative for shortness of breath.   Cardiovascular: Negative for chest pain.  Skin: Positive for wound.  Neurological: Negative for dizziness, syncope and light-headedness.    Allergies  Review of patient's allergies indicates no known allergies.  Home Medications   Current  Outpatient Rx  Name  Route  Sig  Dispense  Refill  . aspirin 81 MG chewable tablet   Oral   Chew 81 mg by mouth daily.          . diazepam (VALIUM) 5 MG tablet   Oral   Take 5 mg by mouth every 12 (twelve) hours as needed for anxiety. For anxiety         . FENOFIBRATE MICRONIZED PO   Oral   Take 1 tablet by mouth every morning.          . insulin aspart protamine- aspart (NOVOLOG MIX 70/30) (70-30) 100 UNIT/ML injection   Subcutaneous   Inject 40 Units into the skin 2 (two) times daily with a meal.         . Melatonin 5 MG TABS   Oral   Take 2-4 tablets by mouth at bedtime as needed. For sleep         . polyethylene glycol powder (GLYCOLAX/MIRALAX) powder   Oral   Take 17 g by mouth every other day.         Marland Kitchen SIMVASTATIN PO   Oral   Take 1 tablet by mouth every other day.          . traZODone (DESYREL) 50 MG tablet   Oral   Take 50 mg by mouth at bedtime as needed for sleep.          Marland Kitchen zolpidem (AMBIEN)  10 MG tablet   Oral   Take 10 mg by mouth at bedtime as needed for sleep.          Triage Vitals: BP 141/84  Pulse 96  Temp(Src) 97.9 F (36.6 C) (Oral)  Resp 20  Ht 5\' 7"  (1.702 m)  Wt 193 lb (87.544 kg)  BMI 30.22 kg/m2  SpO2 96%   Physical Exam  Nursing note and vitals reviewed. Constitutional: He appears well-developed and well-nourished. No distress.  HENT:  Head: Normocephalic and atraumatic.  Neck: Neck supple.  Pulmonary/Chest: Effort normal.  Neurological: He is alert.  Skin: He is not diaphoretic.  Horizontal straight laceration with scabbing overlying it on right buttocks. Very tiny amount of erythema. No warmth, discharge or edema.    ED Course  Procedures (including critical care time)  DIAGNOSTIC STUDIES: Oxygen Saturation is 96% on room air, normal by my interpretation.    COORDINATION OF CARE: 9:39 PM -Recommend continuing soap and water and Neosporin care. Patient verbalizes understanding and agrees with treatment  plan.  9:48 PM Dr. Wilkie Aye also examined laceration and agreed with treatment plan. Pt verbalizes understanding and agrees with treatment plan.    Labs Review Labs Reviewed - No data to display Imaging Review No results found.  EKG Interpretation   None       MDM   1. Abrasion of right buttock, initial encounter    Pt with abrasion to right buttock that is healing well, no signs of infection.  Pt likely has local irritation/reaction from neosporin.  Discussed findings, treatment, and follow up  with patient.  Pt given return precautions.  Pt verbalizes understanding and agrees with plan.      I doubt any other EMC precluding discharge at this time including, but not necessarily limited to the following: cellulitis, abscess, wound infection  I personally performed the services described in this documentation, which was scribed in my presence. The recorded information has been reviewed and is accurate.    Trixie Dredge, PA-C 05/29/13 2232

## 2013-05-29 NOTE — ED Notes (Signed)
Patient has walking shoe on his right foot due to blisters that are being taken care of by his MD.

## 2013-05-29 NOTE — ED Notes (Signed)
Patient stated he got up to use the bathroom and stumbled backwards and hit his buttock on the corner of the telephone table.  Here to have his "scratch" seen.  No bleeding

## 2013-05-30 ENCOUNTER — Telehealth (HOSPITAL_COMMUNITY): Payer: Self-pay | Admitting: *Deleted

## 2013-05-30 NOTE — ED Provider Notes (Signed)
Medical screening examination/treatment/procedure(s) were conducted as a shared visit with non-physician practitioner(s) and myself.  I personally evaluated the patient during the encounter.  EKG Interpretation   None       This is a 72 year old male who presents with concerns for infected abrasion. Patient fell several days ago and his bottom. He has a abrasion to his upper left buttock. His neighbor who is a nurse was concerned that it was red and looked infected. He presents here for further evaluation. He has been using Neosporin. Patient has evidence of superficial abrasion that is healing over the left superior buttock.  There is mild erythema around the abrasion which is more consistent with possible neomycin reaction. There is no fluctuance or redness of the skin concerning for abscess or cellulitis. Patient was encouraged to switch to bacitracin. He has had not been systemically ill and has not fevers.  After history, exam, and medical workup I feel the patient has been appropriately medically screened and is safe for discharge home. Pertinent diagnoses were discussed with the patient. Patient was given return precautions.   Shon Baton, MD 05/30/13 [phone]

## 2013-05-30 NOTE — Telephone Encounter (Signed)
noted 

## 2013-05-30 NOTE — Telephone Encounter (Signed)
  [facility] LOC[facility] AT [facility] [address] [place], [place] [address] [phone]     Our office has attempted to contact your patient.  twice by telephone and we have also sent an appointment letter to schedule the Carotid Doppler test you ordered. The patient has not responded. We will not make any further attempts to contact the patient. If any further assistance is needed for this referral, please contact our office at [phone]  Sincerely, [facility] Scheduling Team

## 2013-06-05 ENCOUNTER — Other Ambulatory Visit: Payer: Self-pay

## 2013-06-05 ENCOUNTER — Encounter (HOSPITAL_BASED_OUTPATIENT_CLINIC_OR_DEPARTMENT_OTHER): Payer: Medicare Other | Attending: General Surgery

## 2013-06-05 DIAGNOSIS — F3289 Other specified depressive episodes: Secondary | ICD-10-CM | POA: Insufficient documentation

## 2013-06-05 DIAGNOSIS — Z794 Long term (current) use of insulin: Secondary | ICD-10-CM | POA: Insufficient documentation

## 2013-06-05 DIAGNOSIS — I999 Unspecified disorder of circulatory system: Secondary | ICD-10-CM | POA: Insufficient documentation

## 2013-06-05 DIAGNOSIS — E119 Type 2 diabetes mellitus without complications: Secondary | ICD-10-CM | POA: Insufficient documentation

## 2013-06-05 DIAGNOSIS — Z79899 Other long term (current) drug therapy: Secondary | ICD-10-CM | POA: Insufficient documentation

## 2013-06-05 DIAGNOSIS — I10 Essential (primary) hypertension: Secondary | ICD-10-CM | POA: Insufficient documentation

## 2013-06-05 DIAGNOSIS — L97509 Non-pressure chronic ulcer of other part of unspecified foot with unspecified severity: Secondary | ICD-10-CM | POA: Insufficient documentation

## 2013-06-05 DIAGNOSIS — I2510 Atherosclerotic heart disease of native coronary artery without angina pectoris: Secondary | ICD-10-CM | POA: Insufficient documentation

## 2013-06-05 DIAGNOSIS — C4441 Basal cell carcinoma of skin of scalp and neck: Secondary | ICD-10-CM | POA: Insufficient documentation

## 2013-06-05 DIAGNOSIS — F329 Major depressive disorder, single episode, unspecified: Secondary | ICD-10-CM | POA: Insufficient documentation

## 2013-06-05 DIAGNOSIS — E785 Hyperlipidemia, unspecified: Secondary | ICD-10-CM | POA: Insufficient documentation

## 2013-06-05 DIAGNOSIS — Z951 Presence of aortocoronary bypass graft: Secondary | ICD-10-CM | POA: Insufficient documentation

## 2013-06-06 NOTE — Progress Notes (Signed)
[facility]  NAME:  [name], [name] NO.:  MEDICAL RECORD NO.:  [id number]      DATE OF BIRTH:  22-Sep-1940  PHYSICIAN:  Wayland Denis, DO       VISIT DATE:  06/05/2013                                  OFFICE VISIT   The patient is a 72 year old gentleman who is here for followup.  He is known to the clinic, but has some new wounds on the third and fourth toes of a right foot.  He says that they have been there for about a month.  He has been trying to elevate it but it does not seem to be getting better and he is concerned about the area is getting worse.  PAST MEDICAL HISTORY:  Positive for diabetes, hypertension, hyperlipidemia, coronary artery disease, pancreatitis, and depression.  SURGERIES:  Coronary artery bypass graft.  He is on Ambien, aspirin, simvastatin, lisinopril, NovoLog, Valtrex, fenofibrate.  ALLERGIES:  No known drug allergies.  REVIEW OF SYSTEMS:  Otherwise negative.  On exam, he is alert, oriented, cooperative, not in any acute distress. He is pleasant.  Pupils are equal.  Extraocular muscles are intact.  No cervical lymphadenopathy.  His breathing is unlabored and his heart rate is regular.  The wounds are superficial scabs on the overlying skin was excised.  The area is include the tips of his 3rd and 4th toes.  Pulses are present but weak.  He does have some vascular disease. Incidentally, he was also found to have a lesion on the left neck that looks like a basal cell carcinoma with a central ulceration, a pearly look to it with raised edges.  ASSESSMENT:  Changing skin lesion in left neck, and bilateral lower extremity 3rd and 4th toes, right foot ulcers, vascular disease, biopsy was done 3 mm to the left neck.  Sterile technique, 1% lidocaine with epinephrine was used to anesthetize the area.  This will be sent for pathology.  The dressing was applied with triple antibiotic ointment and a 4 x 4 gauze  that was taped on the feet recommending a Silvercel soaking every other day, elevation, multivitamin, vitamin C, zinc, and protein.  We will see the patient back in 1 week.     Wayland Denis, DO     CS/MEDQ  D:  06/05/2013  T:  06/06/2013  Job:  [id number]

## 2013-06-11 ENCOUNTER — Emergency Department (HOSPITAL_COMMUNITY)
Admission: EM | Admit: 2013-06-11 | Discharge: 2013-06-11 | Disposition: A | Discharge status: Home / Self Care | Payer: Medicare Other | Source: Home / Self Care | Attending: Family Medicine | Admitting: Family Medicine

## 2013-06-11 ENCOUNTER — Encounter (HOSPITAL_COMMUNITY): Payer: Self-pay | Admitting: Emergency Medicine

## 2013-06-11 DIAGNOSIS — R143 Flatulence: Secondary | ICD-10-CM

## 2013-06-11 DIAGNOSIS — R141 Gas pain: Secondary | ICD-10-CM

## 2013-06-11 MED ORDER — SIMETHICONE 180 MG PO CAPS: ORAL_CAPSULE | ORAL | Status: DC

## 2013-06-11 MED ORDER — POLYETHYLENE GLYCOL 3350 17 G PO PACK: 17.0000 g | PACK | Freq: Every day | ORAL | Status: DC

## 2013-06-11 NOTE — ED Provider Notes (Signed)
CSN: [id number]     Arrival date & time 06/11/13  1450 History   First MD Initiated Contact with Patient 06/11/13 1513     Chief Complaint  Patient presents with  . Constipation   (Consider location/radiation/quality/duration/timing/severity/associated sxs/prior Treatment) HPI Comments: 72 year old male presents complaining of constipation. He further define this as 2 days of intermittent normal bowel movements, one episode of diarrhea, and passing a very small amount of stool when he was passing gas yesterday. He also has a slight bloated sensation in his abdomen. Otherwise he is feeling completely fine. He denies any abdominal pain, fever, chills, chest pain, shortness of breath. He has had a normal bowel movement this morning. He does admit to increased anxiety at home related to money.   Past Medical History  Diagnosis Date  . Diabetes mellitus without complication   . Coronary artery disease   . High cholesterol   . Anxiety    Past Surgical History  Procedure Laterality Date  . Coronary artery bypass graft     Family History  Problem Relation Age of Onset  . Renal Disease Mother   . Cancer Father     Pancreatic   History  Substance Use Topics  . Smoking status: Former Games developer  . Smokeless tobacco: Never Used  . Alcohol Use: No    Review of Systems  Constitutional: Negative for fever, chills, fatigue and unexpected weight change.  HENT: Negative for sore throat.   Eyes: Negative for visual disturbance.  Respiratory: Negative for cough and shortness of breath.   Cardiovascular: Negative for chest pain, palpitations and leg swelling.  Gastrointestinal: Positive for diarrhea and constipation. Negative for nausea, vomiting, abdominal pain and rectal pain.       See HPI  Genitourinary: Negative for dysuria, urgency, frequency and hematuria.  Musculoskeletal: Negative for arthralgias, myalgias, neck pain and neck stiffness.  Skin: Negative for rash.  Neurological: Negative  for dizziness, weakness and light-headedness.  Psychiatric/Behavioral: The patient is nervous/anxious.     Allergies  Review of patient's allergies indicates no known allergies.  Home Medications   Current Outpatient Rx  Name  Route  Sig  Dispense  Refill  . aspirin 81 MG chewable tablet   Oral   Chew 81 mg by mouth daily.          . bacitracin ointment   Topical   Apply 1 application topically 2 (two) times daily.   120 g   0   . diazepam (VALIUM) 5 MG tablet   Oral   Take 5 mg by mouth every 12 (twelve) hours as needed for anxiety. For anxiety         . insulin aspart protamine- aspart (NOVOLOG MIX 70/30) (70-30) 100 UNIT/ML injection   Subcutaneous   Inject 40 Units into the skin 2 (two) times daily with a meal.         . Melatonin 5 MG TABS   Oral   Take 2-4 tablets by mouth at bedtime as needed. For sleep         . polyethylene glycol powder (GLYCOLAX/MIRALAX) powder   Oral   Take 17 g by mouth every other day.         Marland Kitchen SIMVASTATIN PO   Oral   Take 1 tablet by mouth every other day.          . zolpidem (AMBIEN) 10 MG tablet   Oral   Take 10 mg by mouth at bedtime as needed for sleep.         Marland Kitchen  FENOFIBRATE MICRONIZED PO   Oral   Take 1 tablet by mouth every morning.          . polyethylene glycol (MIRALAX / GLYCOLAX) packet   Oral   Take 17 g by mouth daily.   14 each   0   . Simethicone 180 MG CAPS      1 capsule PO Q6 hrs PRN for gas or flatulence   30 capsule   0   . traZODone (DESYREL) 50 MG tablet   Oral   Take 50 mg by mouth at bedtime as needed for sleep.           BP 141/95  Pulse 107  Temp(Src) 97.5 F (36.4 C) (Oral)  Resp 20  SpO2 97% Physical Exam  Nursing note and vitals reviewed. Constitutional: He is oriented to person, place, and time. He appears well-developed and well-nourished. No distress.  HENT:  Head: Normocephalic.  Neck: Neck supple. No JVD present. No tracheal deviation present. No thyromegaly  present.  Cardiovascular: Normal rate, regular rhythm and normal heart sounds.   Pulmonary/Chest: Effort normal and breath sounds normal. No respiratory distress.  Abdominal: Soft. Bowel sounds are normal. He exhibits no distension and no mass. There is no tenderness. There is no rebound and no guarding. A hernia (umbilical, nontender, easily reducible.  Has been there for years ) is present.  Musculoskeletal:       Lumbar back: Normal.  Lymphadenopathy:    He has no cervical adenopathy.  Neurological: He is alert and oriented to person, place, and time. Coordination normal.  Skin: Skin is warm and dry. No rash noted. He is not diaphoretic.  Psychiatric: He has a normal mood and affect. Judgment normal.    ED Course  Procedures (including critical care time) Labs Review Labs Reviewed - No data to display Imaging Review No results found.    MDM   1. Flatulence    On recheck, this patient's heart rate is normal, 95. His exam is completely normal except for a chronic hernia. There are no red flag symptoms and no features of any sort of acute abdomen. We'll give him some MiraLax and simethicone to treat any gas or any constipation, he should followup with his primary care physician.   Meds ordered this encounter  Medications  . Simethicone 180 MG CAPS    Sig: 1 capsule PO Q6 hrs PRN for gas or flatulence    Dispense:  30 capsule    Refill:  0    Order Specific Question:  Supervising Provider    Answer:  Linna Hoff [phone]  . polyethylene glycol (MIRALAX / GLYCOLAX) packet    Sig: Take 17 g by mouth daily.    Dispense:  14 each    Refill:  0    Order Specific Question:  Supervising Provider    Answer:  Bradd Canary D [[id number]]     Graylon Good, PA-C 06/11/13 1550

## 2013-06-11 NOTE — ED Provider Notes (Signed)
Medical screening examination/treatment/procedure(s) were performed by resident physician or non-physician practitioner and as supervising physician I was immediately available for consultation/collaboration.   Barkley Bruns MD.   Linna Hoff, MD 06/11/13 [phone]

## 2013-06-11 NOTE — ED Notes (Signed)
C/o constipation.  Had a small one this AM.  Had gas with feces in it yesterday.  Last normal bowel movement Fri. AM and PM.

## 2013-07-12 ENCOUNTER — Encounter (HOSPITAL_BASED_OUTPATIENT_CLINIC_OR_DEPARTMENT_OTHER): Payer: Medicare Other | Attending: General Surgery

## 2013-07-12 DIAGNOSIS — E1169 Type 2 diabetes mellitus with other specified complication: Secondary | ICD-10-CM | POA: Insufficient documentation

## 2013-07-12 DIAGNOSIS — L97509 Non-pressure chronic ulcer of other part of unspecified foot with unspecified severity: Secondary | ICD-10-CM | POA: Insufficient documentation

## 2013-08-05 ENCOUNTER — Emergency Department (HOSPITAL_COMMUNITY)
Admission: EM | Admit: 2013-08-05 | Discharge: 2013-08-06 | Disposition: A | Discharge status: Home / Self Care | Payer: Medicare Other | Attending: Emergency Medicine | Admitting: Emergency Medicine

## 2013-08-05 ENCOUNTER — Encounter (HOSPITAL_COMMUNITY): Payer: Self-pay | Admitting: Emergency Medicine

## 2013-08-05 DIAGNOSIS — R29818 Other symptoms and signs involving the nervous system: Secondary | ICD-10-CM | POA: Insufficient documentation

## 2013-08-05 DIAGNOSIS — Z87891 Personal history of nicotine dependence: Secondary | ICD-10-CM | POA: Insufficient documentation

## 2013-08-05 DIAGNOSIS — E7800 Pure hypercholesterolemia, unspecified: Secondary | ICD-10-CM | POA: Insufficient documentation

## 2013-08-05 DIAGNOSIS — F411 Generalized anxiety disorder: Secondary | ICD-10-CM | POA: Insufficient documentation

## 2013-08-05 DIAGNOSIS — Z951 Presence of aortocoronary bypass graft: Secondary | ICD-10-CM | POA: Insufficient documentation

## 2013-08-05 DIAGNOSIS — E119 Type 2 diabetes mellitus without complications: Secondary | ICD-10-CM | POA: Insufficient documentation

## 2013-08-05 DIAGNOSIS — Z4800 Encounter for change or removal of nonsurgical wound dressing: Secondary | ICD-10-CM | POA: Insufficient documentation

## 2013-08-05 DIAGNOSIS — Z79899 Other long term (current) drug therapy: Secondary | ICD-10-CM | POA: Insufficient documentation

## 2013-08-05 DIAGNOSIS — I2510 Atherosclerotic heart disease of native coronary artery without angina pectoris: Secondary | ICD-10-CM | POA: Insufficient documentation

## 2013-08-05 DIAGNOSIS — Z794 Long term (current) use of insulin: Secondary | ICD-10-CM | POA: Insufficient documentation

## 2013-08-05 DIAGNOSIS — Z7982 Long term (current) use of aspirin: Secondary | ICD-10-CM | POA: Insufficient documentation

## 2013-08-05 DIAGNOSIS — Z792 Long term (current) use of antibiotics: Secondary | ICD-10-CM | POA: Insufficient documentation

## 2013-08-05 NOTE — ED Notes (Signed)
Pt. requesting new dressing applied at his wounds at right toes , old dressing fell off this evening , pt. is being treated at [facility] , no swelling or drainage .

## 2013-08-05 NOTE — ED Provider Notes (Signed)
CSN: [id number]     Arrival date & time 08/05/13  2312 History   First MD Initiated Contact with Patient 08/05/13 2331     Chief Complaint  Patient presents with  . Dressing Change   (Consider location/radiation/quality/duration/timing/severity/associated sxs/prior Treatment) HPI Comments: Patient here requesting new dressing to the recently debrided wounds to his toes - he states that he got up and the dressing caught on the side of a chair and he pulled this off - he reports no pain, drainage from the area, fever, chills, erythema.  He is being treated at the wound center and last saw them on Thursday.  He is here for changing of this dressing.  The history is provided by the patient. No language interpreter was used.    Past Medical History  Diagnosis Date  . Diabetes mellitus without complication   . Coronary artery disease   . High cholesterol   . Anxiety    Past Surgical History  Procedure Laterality Date  . Coronary artery bypass graft     Family History  Problem Relation Age of Onset  . Renal Disease Mother   . Cancer Father     Pancreatic   History  Substance Use Topics  . Smoking status: Former Research scientist (life sciences)  . Smokeless tobacco: Never Used  . Alcohol Use: No    Review of Systems  Musculoskeletal: Negative for arthralgias, joint swelling and myalgias.  Skin: Positive for wound. Negative for color change, pallor and rash.  All other systems reviewed and are negative.    Allergies  Review of patient's allergies indicates no known allergies.  Home Medications   Current Outpatient Rx  Name  Route  Sig  Dispense  Refill  . aspirin 81 MG chewable tablet   Oral   Chew 81 mg by mouth daily.          . bacitracin ointment   Topical   Apply 1 application topically 2 (two) times daily.   120 g   0   . diazepam (VALIUM) 5 MG tablet   Oral   Take 5 mg by mouth every 12 (twelve) hours as needed for anxiety. For anxiety         . FENOFIBRATE MICRONIZED PO  Oral   Take 1 tablet by mouth every morning.          . insulin aspart protamine- aspart (NOVOLOG MIX 70/30) (70-30) 100 UNIT/ML injection   Subcutaneous   Inject 40 Units into the skin 2 (two) times daily with a meal.         . Melatonin 5 MG TABS   Oral   Take 2-4 tablets by mouth at bedtime as needed. For sleep         . polyethylene glycol (MIRALAX / GLYCOLAX) packet   Oral   Take 17 g by mouth daily.   14 each   0   . polyethylene glycol powder (GLYCOLAX/MIRALAX) powder   Oral   Take 17 g by mouth every other day.         . Simethicone 180 MG CAPS      1 capsule PO Q6 hrs PRN for gas or flatulence   30 capsule   0   . SIMVASTATIN PO   Oral   Take 1 tablet by mouth every other day.          . traZODone (DESYREL) 50 MG tablet   Oral   Take 50 mg by mouth at bedtime as needed for sleep.          Marland Kitchen  zolpidem (AMBIEN) 10 MG tablet   Oral   Take 10 mg by mouth at bedtime as needed for sleep.          BP 145/80  Pulse 106  Temp(Src) 97.3 F (36.3 C) (Oral)  Resp 18  SpO2 94% Physical Exam  Nursing note and vitals reviewed. Constitutional: He is oriented to person, place, and time. He appears well-developed and well-nourished. No distress.  HENT:  Head: Normocephalic.  Mouth/Throat: Oropharynx is clear and moist.  Eyes: Conjunctivae are normal. No scleral icterus.  Neck: Normal range of motion.  Pulmonary/Chest: Effort normal.  Musculoskeletal: Normal range of motion. He exhibits no edema and no tenderness.  Neurological: He is alert and oriented to person, place, and time. He exhibits abnormal muscle tone. Coordination normal.  Skin: Skin is warm and dry. No rash noted. No erythema.  Noted with eschar to tips of right 1st, 2nd and 3rd toes without erythema, drainage, streaking of the wound  Psychiatric: He has a normal mood and affect. His behavior is normal. Judgment and thought content normal.    ED Course  Procedures (including critical  care time) Labs Review Labs Reviewed - No data to display Imaging Review No results found.  EKG Interpretation   None       MDM  Dressing change  Patient here after pulling previous dressing off - we do not have the type of dressing that he normally uses so we will improvise at this time.  He will follow up with Dr. Jerline Pain if needed.   Idalia Needle Aaradhya Kysar, PA-C 08/06/13 0000

## 2013-08-06 NOTE — ED Provider Notes (Signed)
Medical screening examination/treatment/procedure(s) were performed by non-physician practitioner and as supervising physician I was immediately available for consultation/collaboration.   Yania Bogie, MD 08/06/13 0746 

## 2013-08-06 NOTE — Discharge Instructions (Signed)
Dressing Change °A dressing is a material placed over wounds. It keeps the wound clean, dry, and protected from further injury. This provides an environment that favors wound healing.  °BEFORE YOU BEGIN °· Get your supplies together. Things you may need include: °· Saline solution. °· Flexible gauze dressing. °· Medicated cream. °· Tape. °· Gloves. °· Abdominal dressing pads. °· Gauze squares. °· Plastic bags. °· Take pain medicine 30 minutes before the dressing change if you need it. °· Take a shower before you do the first dressing change of the day. Use plastic wrap or a plastic bag to prevent the dressing from getting wet. °REMOVING YOUR OLD DRESSING  °· Wash your hands with soap and water. Dry your hands with a clean towel. °· Put on your gloves. °· Remove any tape. °· Carefully remove the old dressing. If the dressing sticks, you may dampen it with warm water to loosen it, or follow your caregiver's specific directions. °· Remove any gauze or packing tape that is in your wound. °· Take off your gloves. °· Put the gloves, tape, gauze, or any packing tape into a plastic bag. °CHANGING YOUR DRESSING °· Open the supplies. °· Take the cap off the saline solution. °· Open the gauze package so that the gauze remains on the inside of the package. °· Put on your gloves. °· Clean your wound as told by your caregiver. °· If you have been told to keep your wound dry, follow those instructions. °· Your caregiver may tell you to do one or more of the following: °· Pick up the gauze. Pour the saline solution over the gauze. Squeeze out the extra saline solution. °· Put medicated cream or other medicine on your wound if you have been told to do so. °· Put the solution soaked gauze only in your wound, not on the skin around it. °· Pack your wound loosely or as told by your caregiver. °· Put dry gauze on your wound. °· Put abdominal dressing pads over the dry gauze if your wet gauze soaks through. °· Tape the abdominal dressing  pads in place so they will not fall off. Do not wrap the tape completely around the affected part (arm, leg, abdomen). °· Wrap the dressing pads with a flexible gauze dressing to secure it in place. °· Take off your gloves. Put them in the plastic bag with the old dressing. Tie the bag shut and throw it away. °· Keep the dressing clean and dry until your next dressing change. °· Wash your hands. °SEEK MEDICAL CARE IF: °· Your skin around the wound looks red. °· Your wound feels more tender or sore. °· You see pus in the wound. °· Your wound smells bad. °· You have a fever. °· Your skin around the wound has a rash that itches and burns. °· You see black or yellow skin in your wound that was not there before. °· You feel nauseous, throw up, and feel very tired. °Document Released: 07/30/2004 Document Revised: 09/14/2011 Document Reviewed: 05/04/2011 °Exit[employer]® Patient Information ©2014 [employer]. ° °

## 2013-08-09 ENCOUNTER — Encounter (HOSPITAL_BASED_OUTPATIENT_CLINIC_OR_DEPARTMENT_OTHER): Payer: Medicare Other | Attending: General Surgery

## 2013-08-09 DIAGNOSIS — L97509 Non-pressure chronic ulcer of other part of unspecified foot with unspecified severity: Secondary | ICD-10-CM | POA: Insufficient documentation

## 2013-08-09 DIAGNOSIS — E1169 Type 2 diabetes mellitus with other specified complication: Secondary | ICD-10-CM | POA: Insufficient documentation

## 2013-09-18 ENCOUNTER — Encounter (HOSPITAL_BASED_OUTPATIENT_CLINIC_OR_DEPARTMENT_OTHER): Payer: Medicare Other

## 2013-10-17 ENCOUNTER — Encounter (HOSPITAL_BASED_OUTPATIENT_CLINIC_OR_DEPARTMENT_OTHER): Payer: Medicare Other | Attending: General Surgery

## 2013-10-17 DIAGNOSIS — I10 Essential (primary) hypertension: Secondary | ICD-10-CM | POA: Insufficient documentation

## 2013-10-17 DIAGNOSIS — Z794 Long term (current) use of insulin: Secondary | ICD-10-CM | POA: Insufficient documentation

## 2013-10-17 DIAGNOSIS — Z79899 Other long term (current) drug therapy: Secondary | ICD-10-CM | POA: Insufficient documentation

## 2013-10-17 DIAGNOSIS — E785 Hyperlipidemia, unspecified: Secondary | ICD-10-CM | POA: Insufficient documentation

## 2013-10-17 DIAGNOSIS — Z951 Presence of aortocoronary bypass graft: Secondary | ICD-10-CM | POA: Insufficient documentation

## 2013-10-17 DIAGNOSIS — I2510 Atherosclerotic heart disease of native coronary artery without angina pectoris: Secondary | ICD-10-CM | POA: Insufficient documentation

## 2013-10-17 DIAGNOSIS — Z7982 Long term (current) use of aspirin: Secondary | ICD-10-CM | POA: Insufficient documentation

## 2013-10-17 DIAGNOSIS — E1169 Type 2 diabetes mellitus with other specified complication: Secondary | ICD-10-CM | POA: Insufficient documentation

## 2013-10-17 DIAGNOSIS — L97509 Non-pressure chronic ulcer of other part of unspecified foot with unspecified severity: Secondary | ICD-10-CM | POA: Insufficient documentation

## 2013-10-18 NOTE — H&P (Signed)
[name], [name] NO.:  [id number]  MEDICAL RECORD NO.:  [id number]  LOCATION:  FOOT                         FACILITY:  [facility]  PHYSICIAN:  Elesa Hacker, M.D.        DATE OF BIRTH:  1941/05/07  DATE OF ADMISSION:  10/17/2013 DATE OF DISCHARGE:                             HISTORY & PHYSICAL   CHIEF COMPLAINT:  Sore on right great toe.  HISTORY OF PRESENT ILLNESS:  This is a 73 year old male with diabetes, noticed a wound on his right great toe about a month ago.  This was treated with Bactroban and some debridement.  It has not healed.  PAST MEDICAL HISTORY:  Significant for diabetes mellitus type 2, hypertension, hyperlipidemia, coronary artery disease, depression, and a history of panic attacks.  PAST SURGICAL HISTORY:  CABG operation in 2011.  SOCIAL HISTORY:  Cigarettes none for more than 15 years.  Alcohol none.  ALLERGIES:  None.  MEDICATIONS:  Aspirin, Bactroban, fenofibrate, NovoLog, MiraLax, simethicone, simvastatin, Desyrel, and Ambien.  REVIEW OF SYSTEMS:  Essentially as above.  PHYSICAL EXAMINATION:  VITAL SIGNS:  Temperature 97.9, pulse 86, regular, respirations 18, blood pressure 138/72. GENERAL APPEARANCE:  Well developed, well nourished, in no distress. CHEST:  Clear. HEART:  Regular rhythm. EXTREMITIES:  Examination the right lower extremity reveals peripheral pulses are palpable.  There is a 0.5 x 1.0 x 0.1 wound at the tip of the right great toe.  This does not palpate to bone and appears very superficial.  IMPRESSION:  Diabetic foot ulcer, Wagner 2, right great toe.  We will start treating with Santyl and Hydrogel.  We will x-ray the toe.  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     Elesa Hacker, M.D.     RA/MEDQ  D:  10/17/2013  T:  10/18/2013  Job:  [id number]

## 2013-11-15 ENCOUNTER — Encounter (HOSPITAL_BASED_OUTPATIENT_CLINIC_OR_DEPARTMENT_OTHER): Payer: Medicare Other | Attending: General Surgery

## 2013-11-15 DIAGNOSIS — L97509 Non-pressure chronic ulcer of other part of unspecified foot with unspecified severity: Secondary | ICD-10-CM | POA: Insufficient documentation

## 2013-11-15 DIAGNOSIS — L84 Corns and callosities: Secondary | ICD-10-CM | POA: Diagnosis not present

## 2013-11-15 DIAGNOSIS — E1169 Type 2 diabetes mellitus with other specified complication: Secondary | ICD-10-CM | POA: Diagnosis present

## 2014-03-02 ENCOUNTER — Encounter (HOSPITAL_COMMUNITY): Payer: Self-pay | Admitting: Emergency Medicine

## 2014-03-02 ENCOUNTER — Emergency Department (HOSPITAL_COMMUNITY)
Admission: EM | Admit: 2014-03-02 | Discharge: 2014-03-02 | Disposition: A | Discharge status: Home / Self Care | Payer: Medicare Other | Attending: Emergency Medicine | Admitting: Emergency Medicine

## 2014-03-02 DIAGNOSIS — Z7982 Long term (current) use of aspirin: Secondary | ICD-10-CM | POA: Insufficient documentation

## 2014-03-02 DIAGNOSIS — I2510 Atherosclerotic heart disease of native coronary artery without angina pectoris: Secondary | ICD-10-CM | POA: Diagnosis not present

## 2014-03-02 DIAGNOSIS — Z951 Presence of aortocoronary bypass graft: Secondary | ICD-10-CM | POA: Diagnosis not present

## 2014-03-02 DIAGNOSIS — F419 Anxiety disorder, unspecified: Secondary | ICD-10-CM

## 2014-03-02 DIAGNOSIS — Z87891 Personal history of nicotine dependence: Secondary | ICD-10-CM | POA: Diagnosis not present

## 2014-03-02 DIAGNOSIS — K5900 Constipation, unspecified: Secondary | ICD-10-CM | POA: Diagnosis not present

## 2014-03-02 DIAGNOSIS — Z794 Long term (current) use of insulin: Secondary | ICD-10-CM | POA: Diagnosis not present

## 2014-03-02 DIAGNOSIS — E119 Type 2 diabetes mellitus without complications: Secondary | ICD-10-CM | POA: Diagnosis not present

## 2014-03-02 DIAGNOSIS — Z79899 Other long term (current) drug therapy: Secondary | ICD-10-CM | POA: Diagnosis not present

## 2014-03-02 DIAGNOSIS — F411 Generalized anxiety disorder: Secondary | ICD-10-CM | POA: Diagnosis not present

## 2014-03-02 LAB — CBG MONITORING, ED: Glucose-Capillary: 101 mg/dL — ABNORMAL HIGH (ref 70–99)

## 2014-03-02 MED ORDER — SENNOSIDES-DOCUSATE SODIUM 8.6-50 MG PO TABS: 1.0000 | ORAL_TABLET | Freq: Every day | ORAL | Status: AC

## 2014-03-02 NOTE — ED Notes (Signed)
Pt reports being constipated x4 days, no relief with otc meds. No acute distress noted at triage.

## 2014-03-02 NOTE — ED Provider Notes (Signed)
CSN: [id number]     Arrival date & time 03/02/14  1252 History   First MD Initiated Contact with Patient 03/02/14 1526     Chief Complaint  Patient presents with  . Constipation      Patient is a 73 y.o. male presenting with constipation. The history is provided by the patient.  Constipation Severity:  Moderate Time since last bowel movement:  4 days Timing:  Constant Progression:  Worsening Chronicity:  New Stool description:  None produced Relieved by:  None tried Worsened by:  Nothing tried Associated symptoms: no abdominal pain, no dysuria, no fever, no nausea and no vomiting   pt reports that he has had constipation for 4 days No fever/vomiting or abdominal pain He has not tried any medicines  He also reports he has been anxious lately.  He reports he has had a "lot of stress" due to issues with car and lack of money.  He feels his "nerves" are causing his abdominal issues. He denies current SI.  He has no plan to harm himself.  He has no access to weapons at home He reports he hs valium at home for use for anxiety.    Past Medical History  Diagnosis Date  . Diabetes mellitus without complication   . Coronary artery disease   . High cholesterol   . Anxiety    Past Surgical History  Procedure Laterality Date  . Coronary artery bypass graft     Family History  Problem Relation Age of Onset  . Renal Disease Mother   . Cancer Father     Pancreatic   History  Substance Use Topics  . Smoking status: Former Research scientist (life sciences)  . Smokeless tobacco: Never Used  . Alcohol Use: No    Review of Systems  Constitutional: Negative for fever.  Cardiovascular: Negative for chest pain.  Gastrointestinal: Positive for constipation. Negative for nausea, vomiting and abdominal pain.  Genitourinary: Negative for dysuria.  Psychiatric/Behavioral: Negative for suicidal ideas. The patient is nervous/anxious.   All other systems reviewed and are negative.     Allergies  Review of  patient's allergies indicates no known allergies.  Home Medications   Prior to Admission medications   Medication Sig Start Date End Date Taking? Authorizing Provider  aspirin 81 MG chewable tablet Chew 81 mg by mouth daily.    Yes Historical Provider, MD  diazepam (VALIUM) 5 MG tablet Take 5 mg by mouth every 12 (twelve) hours as needed for anxiety. For anxiety   Yes Historical Provider, MD  insulin aspart protamine- aspart (NOVOLOG MIX 70/30) (70-30) 100 UNIT/ML injection Inject 40 Units into the skin 2 (two) times daily with a meal.   Yes Historical Provider, MD  Melatonin 5 MG TABS Take 10 mg by mouth at bedtime as needed. For sleep   Yes Historical Provider, MD  polyethylene glycol powder (GLYCOLAX/MIRALAX) powder Take 17 g by mouth every other day. 12/24/12  Yes Montine Circle, PA-C  traZODone (DESYREL) 50 MG tablet Take 50-100 mg by mouth at bedtime as needed for sleep.    Yes Historical Provider, MD  zolpidem (AMBIEN) 10 MG tablet Take 10 mg by mouth at bedtime as needed for sleep.   Yes Historical Provider, MD  senna-docusate (SENOKOT-S) 8.6-50 MG per tablet Take 1 tablet by mouth daily. 03/02/14   Sharyon Cable, MD   BP 138/86  Pulse 75  Temp(Src) 98.8 F (37.1 C) (Oral)  Resp 20  Ht 5\' 7"  (1.702 m)  Wt 190 lb (  86.183 kg)  BMI 29.75 kg/m2  SpO2 98% Physical Exam CONSTITUTIONAL: Well developed/well nourished, he is anxious HEAD: Normocephalic/atraumatic EYES: EOMI/PERRL ENMT: Mucous membranes moist NECK: supple no meningeal signs SPINE:entire spine nontender CV: S1/S2 noted, no murmurs/rubs/gallops noted LUNGS: Lungs are clear to auscultation bilaterally, no apparent distress ABDOMEN: soft, nontender, no rebound or guarding. Bowel sounds noted throughout abdomen Rectal - no mass.  No abscess noted.  No stool impaction noted.  No blood or melena noted NEURO: Pt is awake/alert, moves all extremitiesx4 EXTREMITIES: pulses normal, full ROM SKIN: warm, color normal PSYCH:  anxious  ED Course  Procedures  For constipaton:  He has no focal abd tenderness and no signs of stool impaction.  I offered acute abd series for further evaluation but he did not want to wait for any imaging.  For his anxiety = he reports he feels safe at home and has no suicidal ideation at this time.  I spoke to this patient at length that if he felt unsafe at home or felt he needed further assistance we could consult behavioral health.  He did not want any further evaluation.    I feel he is safe/stable for d/c home.  Advised of strict return precautions Labs Review Labs Reviewed  CBG MONITORING, ED - Abnormal; Notable for the following:    Glucose-Capillary 101 (*)    All other components within normal limits    MDM   Final diagnoses:  Constipation, unspecified constipation type  Anxiety    Nursing notes including past medical history and social history reviewed and considered in documentation Labs/vital reviewed and considered     Sharyon Cable, MD 03/02/14 2342

## 2014-03-02 NOTE — Discharge Instructions (Signed)

## 2014-03-02 NOTE — ED Notes (Signed)
Doctor at bedside. Nurse at bedside when Doctor performed rectal exam.

## 2014-03-18 ENCOUNTER — Emergency Department (HOSPITAL_COMMUNITY)
Admission: EM | Admit: 2014-03-18 | Discharge: 2014-03-18 | Disposition: A | Discharge status: Home / Self Care | Payer: Medicare Other | Attending: Emergency Medicine | Admitting: Emergency Medicine

## 2014-03-18 ENCOUNTER — Encounter (HOSPITAL_COMMUNITY): Payer: Self-pay | Admitting: Emergency Medicine

## 2014-03-18 ENCOUNTER — Emergency Department (HOSPITAL_COMMUNITY): Payer: Medicare Other

## 2014-03-18 DIAGNOSIS — R1032 Left lower quadrant pain: Secondary | ICD-10-CM | POA: Insufficient documentation

## 2014-03-18 DIAGNOSIS — Z79899 Other long term (current) drug therapy: Secondary | ICD-10-CM | POA: Insufficient documentation

## 2014-03-18 DIAGNOSIS — E119 Type 2 diabetes mellitus without complications: Secondary | ICD-10-CM | POA: Diagnosis not present

## 2014-03-18 DIAGNOSIS — R142 Eructation: Secondary | ICD-10-CM

## 2014-03-18 DIAGNOSIS — Z794 Long term (current) use of insulin: Secondary | ICD-10-CM | POA: Insufficient documentation

## 2014-03-18 DIAGNOSIS — K5900 Constipation, unspecified: Secondary | ICD-10-CM | POA: Insufficient documentation

## 2014-03-18 DIAGNOSIS — Z951 Presence of aortocoronary bypass graft: Secondary | ICD-10-CM | POA: Diagnosis not present

## 2014-03-18 DIAGNOSIS — R143 Flatulence: Secondary | ICD-10-CM | POA: Diagnosis not present

## 2014-03-18 DIAGNOSIS — I2510 Atherosclerotic heart disease of native coronary artery without angina pectoris: Secondary | ICD-10-CM | POA: Diagnosis not present

## 2014-03-18 DIAGNOSIS — R141 Gas pain: Secondary | ICD-10-CM | POA: Diagnosis not present

## 2014-03-18 DIAGNOSIS — F411 Generalized anxiety disorder: Secondary | ICD-10-CM | POA: Diagnosis not present

## 2014-03-18 DIAGNOSIS — Z87891 Personal history of nicotine dependence: Secondary | ICD-10-CM | POA: Diagnosis not present

## 2014-03-18 LAB — CBC WITH DIFFERENTIAL/PLATELET
Basophils Absolute: 0 10*3/uL (ref 0.0–0.1)
Basophils Relative: 0 % (ref 0–1)
EOS ABS: 0.2 10*3/uL (ref 0.0–0.7)
EOS PCT: 2 % (ref 0–5)
HEMATOCRIT: 44.5 % (ref 39.0–52.0)
Hemoglobin: 15.8 g/dL (ref 13.0–17.0)
LYMPHS ABS: 2.5 10*3/uL (ref 0.7–4.0)
LYMPHS PCT: 27 % (ref 12–46)
MCH: 31 pg (ref 26.0–34.0)
MCHC: 35.5 g/dL (ref 30.0–36.0)
MCV: 87.4 fL (ref 78.0–100.0)
MONO ABS: 0.8 10*3/uL (ref 0.1–1.0)
MONOS PCT: 9 % (ref 3–12)
Neutro Abs: 5.7 10*3/uL (ref 1.7–7.7)
Neutrophils Relative %: 62 % (ref 43–77)
PLATELETS: 203 10*3/uL (ref 150–400)
RBC: 5.09 MIL/uL (ref 4.22–5.81)
RDW: 12.7 % (ref 11.5–15.5)
WBC: 9.1 10*3/uL (ref 4.0–10.5)

## 2014-03-18 LAB — COMPREHENSIVE METABOLIC PANEL
ALT: 14 U/L (ref 0–53)
ANION GAP: 13 (ref 5–15)
AST: 18 U/L (ref 0–37)
Albumin: 3.9 g/dL (ref 3.5–5.2)
Alkaline Phosphatase: 99 U/L (ref 39–117)
BUN: 11 mg/dL (ref 6–23)
CALCIUM: 9.2 mg/dL (ref 8.4–10.5)
CO2: 24 meq/L (ref 19–32)
CREATININE: 0.79 mg/dL (ref 0.50–1.35)
Chloride: 100 mEq/L (ref 96–112)
GFR, EST NON AFRICAN AMERICAN: 89 mL/min — AB (ref >90)
GLUCOSE: 193 mg/dL — AB (ref 70–99)
Potassium: 4.7 mEq/L (ref 3.7–5.3)
SODIUM: 137 meq/L (ref 137–147)
TOTAL PROTEIN: 7.3 g/dL (ref 6.0–8.3)
Total Bilirubin: 0.4 mg/dL (ref 0.3–1.2)

## 2014-03-18 MED ORDER — IOHEXOL 300 MG/ML  SOLN
100.0000 mL | Freq: Once | INTRAMUSCULAR | Status: AC | PRN
  Administered 2014-03-18: 100 mL via INTRAVENOUS

## 2014-03-18 MED ORDER — SODIUM CHLORIDE 0.9 % IV BOLUS (SEPSIS)
1000.0000 mL | Freq: Once | INTRAVENOUS | Status: AC
  Administered 2014-03-18: 1000 mL via INTRAVENOUS

## 2014-03-18 MED ORDER — IOHEXOL 300 MG/ML  SOLN: 25.0000 mL | INTRAMUSCULAR | Status: DC | PRN

## 2014-03-18 NOTE — ED Provider Notes (Signed)
CSN: [id number]     Arrival date & time 03/18/14  1345 History   First MD Initiated Contact with Patient 03/18/14 1547     Chief Complaint  Patient presents with  . Constipation  . Abdominal Pain     (Consider location/radiation/quality/duration/timing/severity/associated sxs/prior Treatment) Patient is a 73 y.o. male presenting with constipation and abdominal pain. The history is provided by the patient.  Constipation Severity:  Moderate Timing:  Constant Progression:  Unchanged Chronicity:  New Context: dietary changes (decreased PO appetite)   Context: not dehydration   Stool description:  None produced Relieved by:  Nothing Worsened by:  Nothing tried Associated symptoms: flatus (last flatus a few minutes ago)   Associated symptoms: no abdominal pain, no fever and no vomiting   Abdominal Pain Associated symptoms: constipation and flatus (last flatus a few minutes ago)   Associated symptoms: no chills, no cough, no fever, no shortness of breath and no vomiting     Past Medical History  Diagnosis Date  . Diabetes mellitus without complication   . Coronary artery disease   . High cholesterol   . Anxiety    Past Surgical History  Procedure Laterality Date  . Coronary artery bypass graft     Family History  Problem Relation Age of Onset  . Renal Disease Mother   . Cancer Father     Pancreatic   History  Substance Use Topics  . Smoking status: Former Research scientist (life sciences)  . Smokeless tobacco: Never Used  . Alcohol Use: No    Review of Systems  Constitutional: Negative for fever and chills.  Respiratory: Negative for cough and shortness of breath.   Gastrointestinal: Positive for constipation and flatus (last flatus a few minutes ago). Negative for vomiting and abdominal pain.  All other systems reviewed and are negative.     Allergies  Review of patient's allergies indicates no known allergies.  Home Medications   Prior to Admission medications   Medication Sig  Start Date End Date Taking? Authorizing Provider  diazepam (VALIUM) 5 MG tablet Take 5 mg by mouth every 12 (twelve) hours as needed for anxiety. For anxiety   Yes Historical Provider, MD  insulin aspart protamine- aspart (NOVOLOG MIX 70/30) (70-30) 100 UNIT/ML injection Inject 40 Units into the skin 2 (two) times daily with a meal.   Yes Historical Provider, MD  Melatonin 5 MG TABS Take 10 mg by mouth at bedtime as needed (sleep).    Yes Historical Provider, MD  polyethylene glycol powder (GLYCOLAX/MIRALAX) powder Take 17 g by mouth every other day. 12/24/12  Yes Montine Circle, PA-C  senna-docusate (SENOKOT-S) 8.6-50 MG per tablet Take 1 tablet by mouth daily. 03/02/14  Yes Sharyon Cable, MD  traZODone (DESYREL) 50 MG tablet Take 50-100 mg by mouth at bedtime as needed for sleep.    Yes Historical Provider, MD  zolpidem (AMBIEN) 10 MG tablet Take 10 mg by mouth at bedtime as needed for sleep.   Yes Historical Provider, MD   BP 156/77  Pulse 82  Temp(Src) 98 F (36.7 C) (Oral)  Resp 10  SpO2 96% Physical Exam  Nursing note and vitals reviewed. Constitutional: He is oriented to person, place, and time. He appears well-developed and well-nourished. No distress.  HENT:  Head: Normocephalic and atraumatic.  Mouth/Throat: No oropharyngeal exudate.  Eyes: EOM are normal. Pupils are equal, round, and reactive to light.  Neck: Normal range of motion. Neck supple.  Cardiovascular: Normal rate and regular rhythm.  Exam reveals no  friction rub.   No murmur heard. Pulmonary/Chest: Effort normal and breath sounds normal. No respiratory distress. He has no wheezes. He has no rales.  Abdominal: Soft. He exhibits no distension. There is tenderness (mild, LLQ). There is no rebound.  Genitourinary: Rectal exam shows no mass and anal tone normal.  No impaction  Musculoskeletal: Normal range of motion. He exhibits no edema.  Neurological: He is alert and oriented to person, place, and time.  Skin: No  rash noted. He is not diaphoretic.    ED Course  Procedures (including critical care time) Labs Review Labs Reviewed  COMPREHENSIVE METABOLIC PANEL - Abnormal; Notable for the following:    Glucose, Bld 193 (*)    GFR calc non Af Amer 89 (*)    All other components within normal limits  CBC WITH DIFFERENTIAL    Imaging Review Ct Abdomen Pelvis W Contrast  03/18/2014   CLINICAL DATA:  Abdominal pain.  Constipation  EXAM: CT ABDOMEN AND PELVIS WITH CONTRAST  TECHNIQUE: Multidetector CT imaging of the abdomen and pelvis was performed using the standard protocol following bolus administration of intravenous contrast.  CONTRAST:  179mL OMNIPAQUE IOHEXOL 300 MG/ML  SOLN  COMPARISON:  12/24/2012 radiographs.  FINDINGS: Musculoskeletal: Likely chronic L1 superior endplate compression fracture with Schmorl's node. No aggressive osseous lesions. 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  Lung Bases: Basilar pulmonary fibrosis. Coronary artery atherosclerosis is present. If office based assessment of coronary risk factors has not been performed, it is now recommended.  Liver:  Normal.  Spleen:  Normal.  Gallbladder:  Normal.  Common bile duct:  Normal.  Pancreas: Complete fatty atrophy of the body and tail of the pancreas. Head and uncinate appears within normal limits.  Adrenal glands:  Normal.  Kidneys: Normal enhancement. Normal delayed excretion of contrast. No calculi. The ureters are within normal limits.  Stomach:  Small hiatal hernia.  Patulous distal esophagus.  Small bowel: Duodenum appears normal. No small bowel obstruction. No mesenteric adenopathy.  Colon: Normal appendix. Moderate stool burden. No inflammatory changes.  Pelvic Genitourinary:  Urinary bladder normal.  Prostatomegaly.  Vasculature: Atherosclerosis. No acute vascular abnormality. Ectatic RIGHT common iliac artery.  Body Wall: Fat containing periumbilical hernia.  IMPRESSION: 1. No acute abnormality. 2. Basilar pulmonary  fibrosis. 3. Small hiatal hernia. 4. Atherosclerosis and coronary artery disease. 5. Prostatomegaly.   Electronically Signed   By: Dereck Ligas M.D.   On: 03/18/2014 18:31     EKG Interpretation None      MDM   Final diagnoses:  Constipation, unspecified constipation type    79M presents with constipation. Last BM 5 days ago, feels bloated. Last good BM about 8 days ago. No fevers, no vomiting, no prior abdominal surgery. AFVSS here. On exam, mild LLQ pain. No guarding or rebound. Rectal exam normal. Will scan. Likely constipated. CT ok. Patient recommended to use mag citrate, miralax for constipation. Stable for discharge.    Evelina Bucy, MD 03/18/14 [place]

## 2014-03-18 NOTE — ED Notes (Signed)
Pt reports that he has not felt well for that past couple of days. States that he has not had a bowel movement in 5 days. Denies any nausea or vomiting. Reports that he has had some increased stress in his life recently.

## 2014-03-18 NOTE — ED Notes (Addendum)
Pt states he is not Nauseous. Pt states he is able to pass gas. Pt also states he has still been able to eat and drink

## 2014-03-18 NOTE — ED Notes (Signed)
Discharge instructions reviewed with pt. Pt verbalized understanding.   

## 2014-03-18 NOTE — Discharge Instructions (Signed)

## 2014-03-18 NOTE — ED Notes (Signed)
Pt finished oral contrast. Notified CT.  

## 2014-08-31 ENCOUNTER — Encounter (HOSPITAL_COMMUNITY): Payer: Self-pay | Admitting: Emergency Medicine

## 2014-08-31 ENCOUNTER — Emergency Department (HOSPITAL_COMMUNITY)
Admission: EM | Admit: 2014-08-31 | Discharge: 2014-08-31 | Disposition: A | Discharge status: Home / Self Care | Payer: Medicare Other | Source: Home / Self Care | Attending: Family Medicine | Admitting: Family Medicine

## 2014-08-31 DIAGNOSIS — F5105 Insomnia due to other mental disorder: Secondary | ICD-10-CM

## 2014-08-31 DIAGNOSIS — F409 Phobic anxiety disorder, unspecified: Secondary | ICD-10-CM

## 2014-08-31 DIAGNOSIS — J069 Acute upper respiratory infection, unspecified: Secondary | ICD-10-CM

## 2014-08-31 MED ORDER — TRAZODONE HCL 50 MG PO TABS: 50.0000 mg | ORAL_TABLET | Freq: Every day | ORAL | Status: DC

## 2014-08-31 MED ORDER — MIRTAZAPINE 15 MG PO TBDP: 15.0000 mg | ORAL_TABLET | Freq: Every day | ORAL | Status: AC

## 2014-08-31 MED ORDER — IPRATROPIUM BROMIDE 0.06 % NA SOLN: 2.0000 | Freq: Four times a day (QID) | NASAL | Status: AC

## 2014-08-31 NOTE — ED Notes (Signed)
Pt has been suffering from a head cold for about a month.  He states it is due to lack of sleep since he can not get a Rx for Ambien anymore.  He also is concerned about abrasions he has due to a fall he had a few weeks ago.

## 2014-08-31 NOTE — ED Provider Notes (Addendum)
CSN: [id number]     Arrival date & time 08/31/14  1513 History   First MD Initiated Contact with Patient 08/31/14 1619     Chief Complaint  Patient presents with  . Nasal Congestion   (Consider location/radiation/quality/duration/timing/severity/associated sxs/prior Treatment) Patient is a 74 y.o. male presenting with URI. The history is provided by the patient.  URI Presenting symptoms: congestion and rhinorrhea   Presenting symptoms: no fever and no sore throat   Severity:  Mild Onset quality:  Gradual Duration:  1 week Progression:  Unchanged Chronicity:  New Relieved by:  None tried Worsened by:  Nothing tried Ineffective treatments:  None tried   Past Medical History  Diagnosis Date  . Diabetes mellitus without complication   . Coronary artery disease   . High cholesterol   . Anxiety    Past Surgical History  Procedure Laterality Date  . Coronary artery bypass graft     Family History  Problem Relation Age of Onset  . Renal Disease Mother   . Cancer Father     Pancreatic   History  Substance Use Topics  . Smoking status: Former Research scientist (life sciences)  . Smokeless tobacco: Never Used  . Alcohol Use: No    Review of Systems  Constitutional: Negative.  Negative for fever, chills and appetite change.  HENT: Positive for congestion, postnasal drip and rhinorrhea. Negative for sore throat.     Allergies  Review of patient's allergies indicates no known allergies.  Home Medications   Prior to Admission medications   Medication Sig Start Date End Date Taking? Authorizing Provider  diazepam (VALIUM) 5 MG tablet Take 5 mg by mouth every 12 (twelve) hours as needed for anxiety. For anxiety   Yes Historical Provider, MD  insulin aspart protamine- aspart (NOVOLOG MIX 70/30) (70-30) 100 UNIT/ML injection Inject 40 Units into the skin 2 (two) times daily with a meal.   Yes Historical Provider, MD  Melatonin 5 MG TABS Take 10 mg by mouth at bedtime as needed (sleep).    Yes  Historical Provider, MD  ipratropium (ATROVENT) 0.06 % nasal spray Place 2 sprays into both nostrils 4 (four) times daily. 08/31/14   Billy Fischer, MD  mirtazapine (REMERON SOL-TAB) 15 MG disintegrating tablet Take 1 tablet (15 mg total) by mouth at bedtime. 08/31/14   Billy Fischer, MD  polyethylene glycol powder (GLYCOLAX/MIRALAX) powder Take 17 g by mouth every other day. 12/24/12   Montine Circle, PA-C  senna-docusate (SENOKOT-S) 8.6-50 MG per tablet Take 1 tablet by mouth daily. 03/02/14   Sharyon Cable, MD  zolpidem (AMBIEN) 10 MG tablet Take 10 mg by mouth at bedtime as needed for sleep.    Historical Provider, MD   BP 157/96 mmHg  Pulse 103  Temp(Src) 97.8 F (36.6 C) (Oral)  Resp 20  SpO2 93% Physical Exam  Constitutional: He is oriented to person, place, and time. He appears well-developed and well-nourished. No distress.  HENT:  Right Ear: External ear normal.  Left Ear: External ear normal.  Nose: Mucosal edema and rhinorrhea present.  Mouth/Throat: Oropharynx is clear and moist.  Eyes: Pupils are equal, round, and reactive to light.  Neck: Normal range of motion. Neck supple.  Cardiovascular: Normal heart sounds and intact distal pulses.   Pulmonary/Chest: Effort normal and breath sounds normal.  Lymphadenopathy:    He has no cervical adenopathy.  Neurological: He is alert and oriented to person, place, and time.  Skin: Skin is warm and dry.  Nursing note  and vitals reviewed.   ED Course  Procedures (including critical care time) Labs Review Labs Reviewed - No data to display  Imaging Review No results found.   MDM   1. URI (upper respiratory infection)   2. Insomnia due to anxiety and fear       Billy Fischer, MD 08/31/14 1636  Billy Fischer, MD 08/31/14 [phone]

## 2014-10-03 ENCOUNTER — Encounter (HOSPITAL_BASED_OUTPATIENT_CLINIC_OR_DEPARTMENT_OTHER): Payer: Medicare Other

## 2014-10-19 ENCOUNTER — Encounter (HOSPITAL_BASED_OUTPATIENT_CLINIC_OR_DEPARTMENT_OTHER): Payer: Medicare Other | Attending: Internal Medicine

## 2014-10-19 DIAGNOSIS — L97511 Non-pressure chronic ulcer of other part of right foot limited to breakdown of skin: Secondary | ICD-10-CM | POA: Diagnosis not present

## 2014-10-19 DIAGNOSIS — I2510 Atherosclerotic heart disease of native coronary artery without angina pectoris: Secondary | ICD-10-CM | POA: Insufficient documentation

## 2014-10-19 DIAGNOSIS — E11621 Type 2 diabetes mellitus with foot ulcer: Secondary | ICD-10-CM | POA: Diagnosis not present

## 2014-10-19 DIAGNOSIS — I10 Essential (primary) hypertension: Secondary | ICD-10-CM | POA: Diagnosis not present

## 2014-10-26 ENCOUNTER — Other Ambulatory Visit: Payer: Self-pay | Admitting: Internal Medicine

## 2014-10-26 ENCOUNTER — Ambulatory Visit (HOSPITAL_COMMUNITY)
Admission: RE | Admit: 2014-10-26 | Discharge: 2014-10-26 | Disposition: A | Discharge status: Home / Self Care | Payer: Medicare Other | Source: Ambulatory Visit | Attending: Internal Medicine | Admitting: Internal Medicine

## 2014-10-26 DIAGNOSIS — M795 Residual foreign body in soft tissue: Secondary | ICD-10-CM | POA: Diagnosis not present

## 2014-10-26 DIAGNOSIS — I2510 Atherosclerotic heart disease of native coronary artery without angina pectoris: Secondary | ICD-10-CM | POA: Diagnosis not present

## 2014-10-26 DIAGNOSIS — E11621 Type 2 diabetes mellitus with foot ulcer: Secondary | ICD-10-CM | POA: Diagnosis not present

## 2014-10-26 DIAGNOSIS — M869 Osteomyelitis, unspecified: Secondary | ICD-10-CM

## 2014-10-26 DIAGNOSIS — L97511 Non-pressure chronic ulcer of other part of right foot limited to breakdown of skin: Secondary | ICD-10-CM | POA: Diagnosis not present

## 2014-10-26 DIAGNOSIS — I10 Essential (primary) hypertension: Secondary | ICD-10-CM | POA: Diagnosis not present

## 2014-10-30 IMAGING — CT CT ABD-PELV W/ CM
2 of 5 series · 17 of 46 positions shown, 19 images · IV contrast (APPLIED)
Comparison: 12/24/2012 radiographs.

CLINICAL DATA: Abdominal pain.  Constipation

EXAM:
CT ABDOMEN AND PELVIS WITH CONTRAST
TECHNIQUE: Multidetector CT imaging of the abdomen and pelvis was performed
using the standard protocol following bolus administration of
intravenous contrast.
CONTRAST:  100mL OMNIPAQUE IOHEXOL 300 MG/ML  SOLN

[Series 2: abd/ pelvis 5.0 i30f 1 · axial · 0.78mm/px · z∈[-456,-61]mm · 14 of 89 slices shown, 16 images]
[im 5/89  soft-tissue]
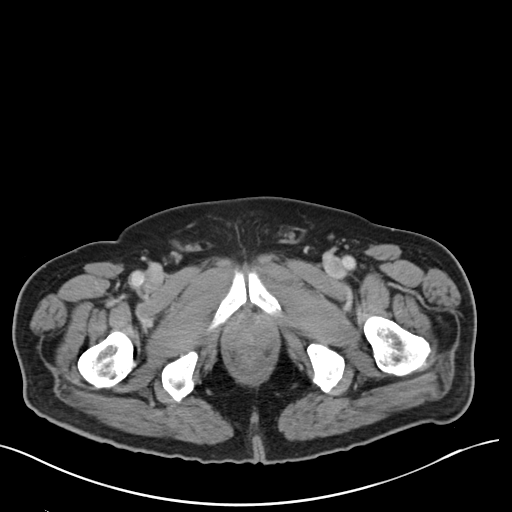
[im 5/89  bone]
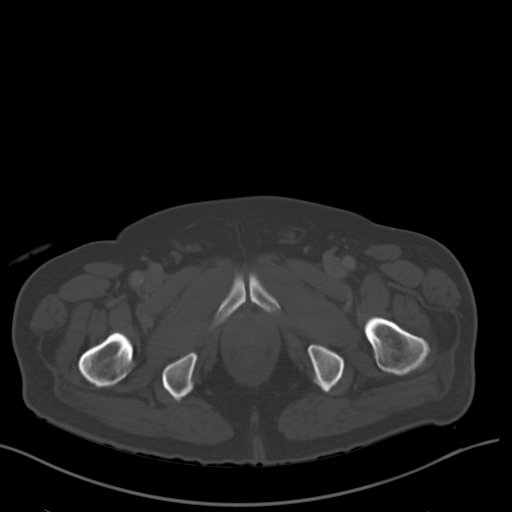
[im 10/89  soft-tissue]
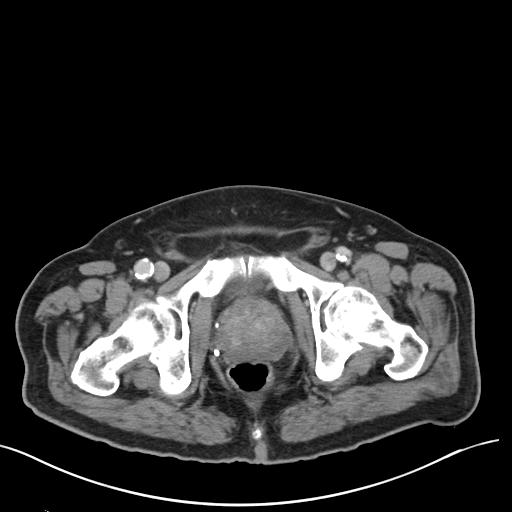
[im 19/89  soft-tissue]
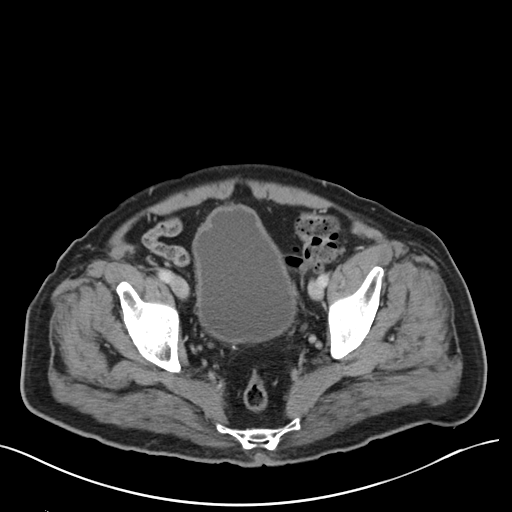
[im 24/89  soft-tissue]
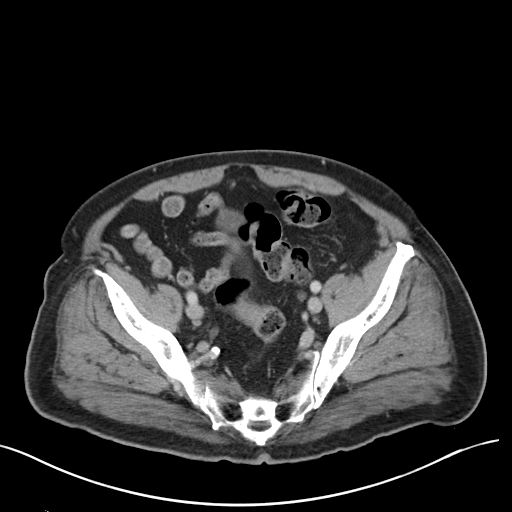
[im 28/89  soft-tissue]
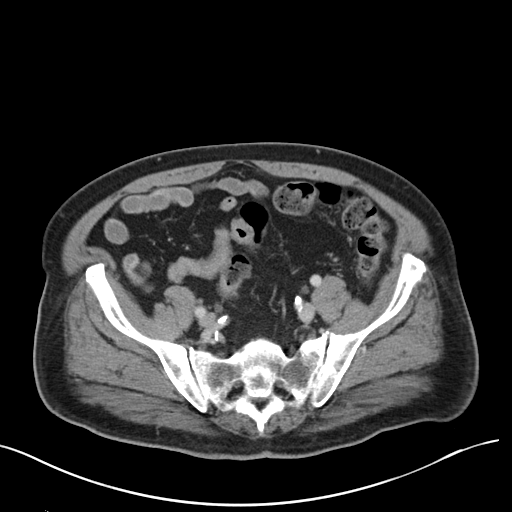
[im 38/89  soft-tissue]
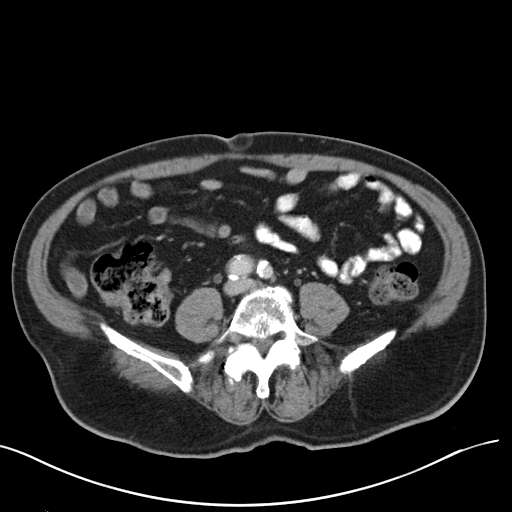
[im 42/89  soft-tissue]
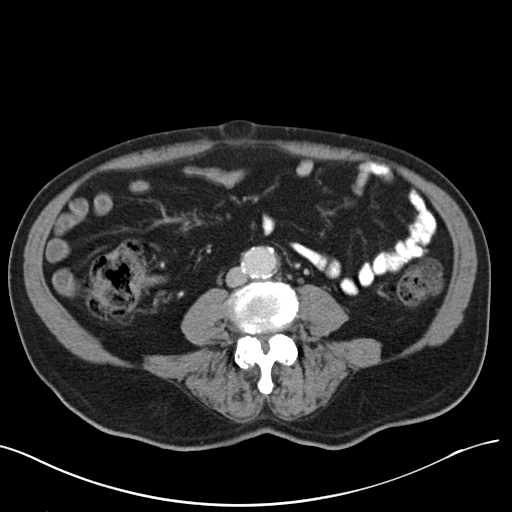
[im 47/89  soft-tissue]
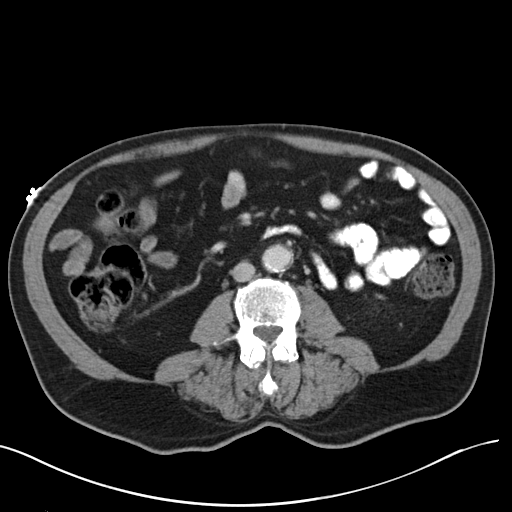
[im 51/89  soft-tissue]
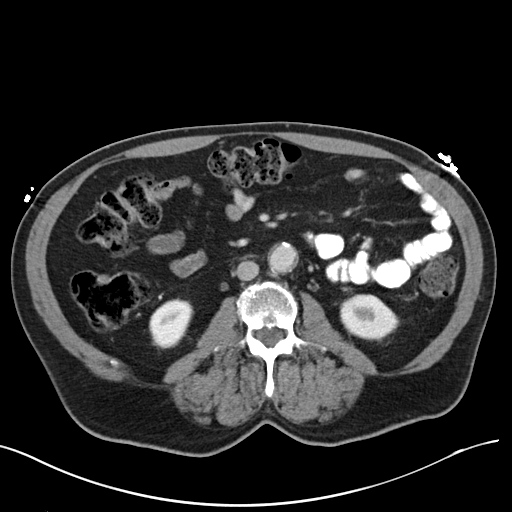
[im 51/89  bone]
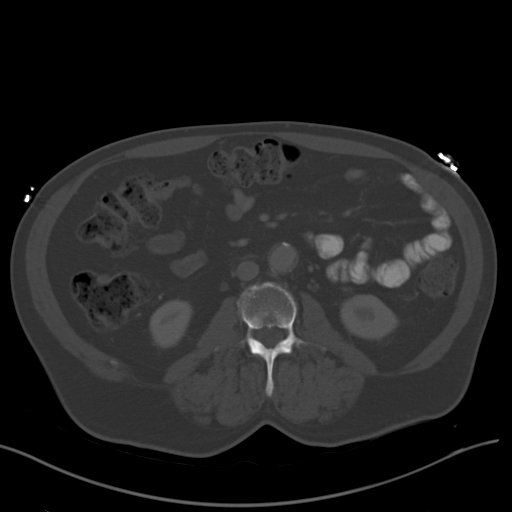
[im 61/89  soft-tissue]
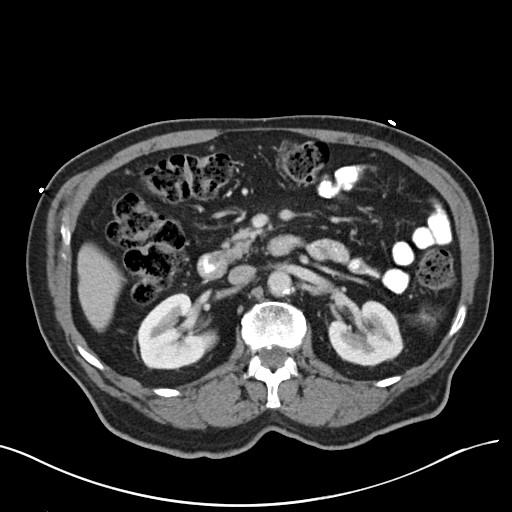
[im 65/89  soft-tissue]
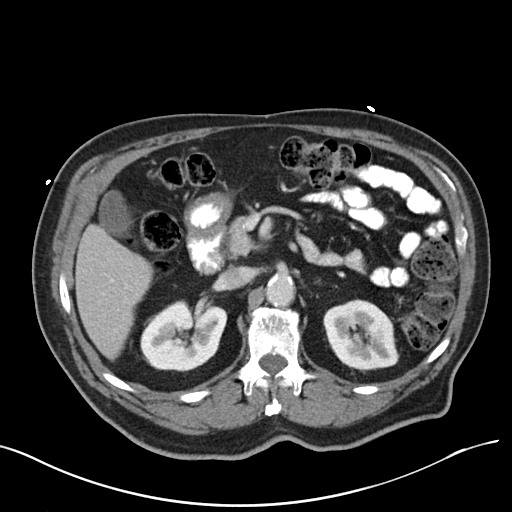
[im 70/89  soft-tissue]
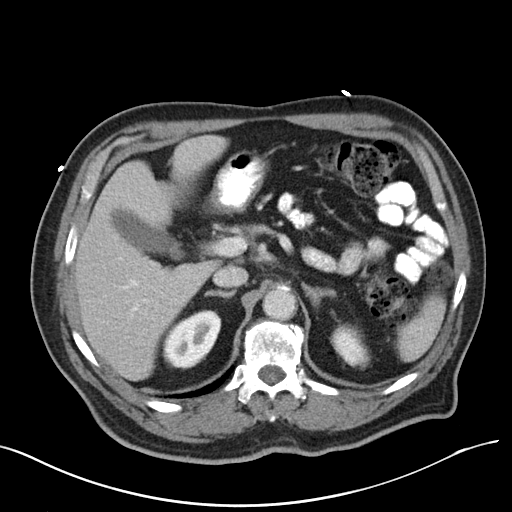
[im 79/89  soft-tissue]
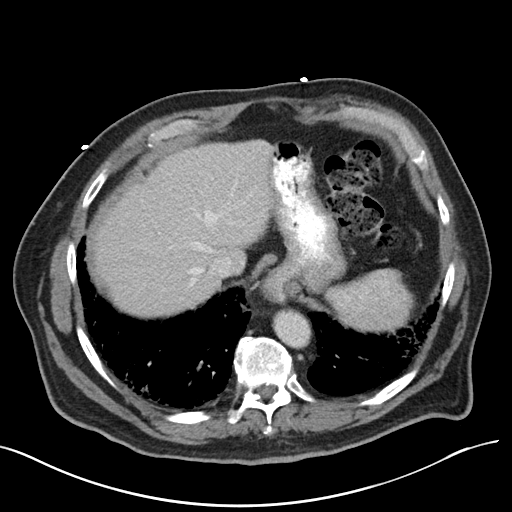
[im 84/89  soft-tissue]
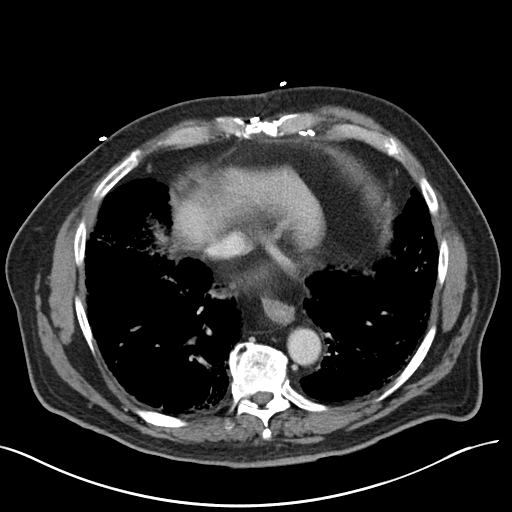

[Series 5: coronal soft tissue · coronal · 0.81mm/px · 3 of 90 slices shown]
[im 30/90  soft-tissue]
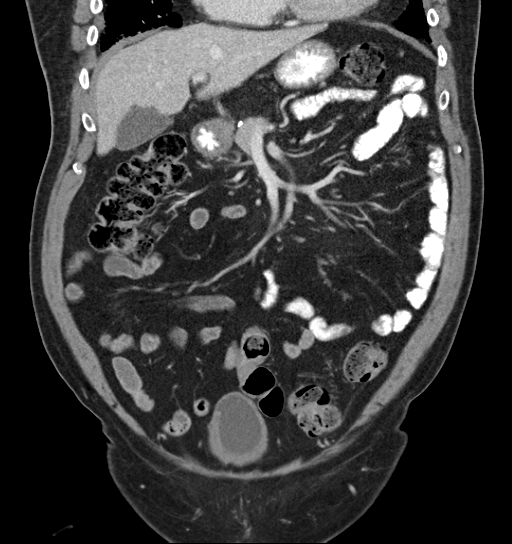
[im 40/90  soft-tissue]
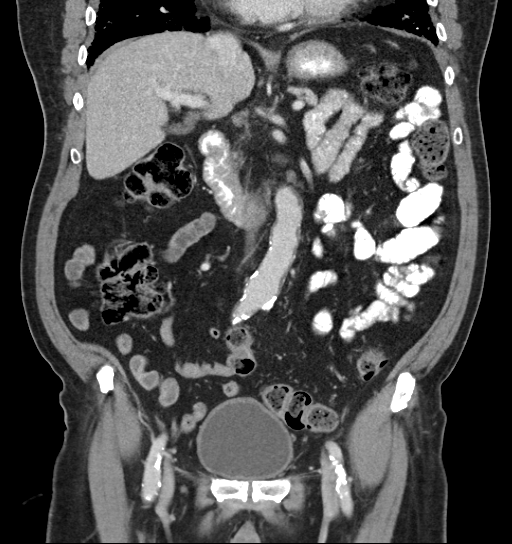
[im 50/90  soft-tissue]
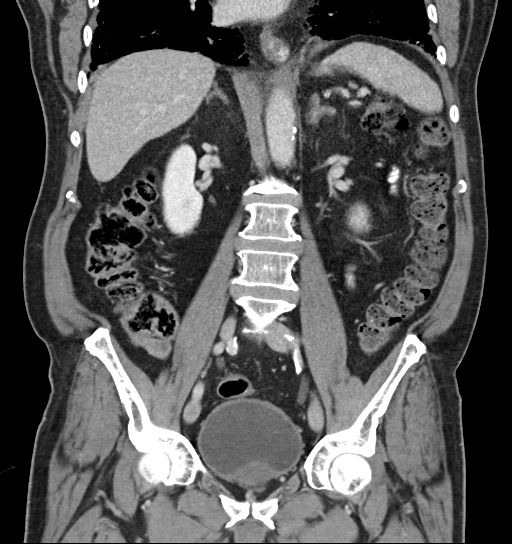

[17 of 46 positions shown; findings below may reference images not displayed]

FINDINGS: Musculoskeletal: Likely chronic L1 superior endplate compression
fracture with Schmorl's node. No aggressive osseous lesions.
Bilateral hip osteoarthritis is mild to moderate.

Lung Bases: Basilar pulmonary fibrosis. Coronary artery
atherosclerosis is present. If office based assessment of coronary
risk factors has not been performed, it is now recommended.

Liver:  Normal.

Spleen:  Normal.

Gallbladder:  Normal.

Common bile duct:  Normal.

Pancreas: Complete fatty atrophy of the body and tail of the
pancreas. Head and uncinate appears within normal limits.

Adrenal glands:  Normal.

Kidneys: Normal enhancement. Normal delayed excretion of contrast.
No calculi. The ureters are within normal limits.

Stomach:  Small hiatal hernia.  Patulous distal esophagus.

Small bowel: Duodenum appears normal. No small bowel obstruction. No
mesenteric adenopathy.

Colon: Normal appendix. Moderate stool burden. No inflammatory
changes.

Pelvic Genitourinary:  Urinary bladder normal.  Prostatomegaly.

Vasculature: Atherosclerosis. No acute vascular abnormality. Ectatic
RIGHT common iliac artery.

Body Wall: Fat containing periumbilical hernia.
IMPRESSION: 1. No acute abnormality.
2. Basilar pulmonary fibrosis.
3. Small hiatal hernia.
4. Atherosclerosis and coronary artery disease.
5. Prostatomegaly.

## 2014-11-02 DIAGNOSIS — L97511 Non-pressure chronic ulcer of other part of right foot limited to breakdown of skin: Secondary | ICD-10-CM | POA: Diagnosis not present

## 2014-11-02 DIAGNOSIS — I10 Essential (primary) hypertension: Secondary | ICD-10-CM | POA: Diagnosis not present

## 2014-11-02 DIAGNOSIS — I2510 Atherosclerotic heart disease of native coronary artery without angina pectoris: Secondary | ICD-10-CM | POA: Diagnosis not present

## 2014-11-02 DIAGNOSIS — E11621 Type 2 diabetes mellitus with foot ulcer: Secondary | ICD-10-CM | POA: Diagnosis not present

## 2015-02-05 ENCOUNTER — Encounter: Payer: Self-pay | Admitting: Cardiovascular Disease

## 2015-07-14 ENCOUNTER — Emergency Department (HOSPITAL_COMMUNITY): Payer: Medicare Other

## 2015-07-14 ENCOUNTER — Encounter (HOSPITAL_COMMUNITY): Payer: Self-pay | Admitting: Emergency Medicine

## 2015-07-14 ENCOUNTER — Emergency Department (HOSPITAL_COMMUNITY)
Admission: EM | Admit: 2015-07-14 | Discharge: 2015-07-18 | Disposition: A | Discharge status: Home / Self Care | Payer: Medicare Other | Attending: Emergency Medicine | Admitting: Emergency Medicine

## 2015-07-14 DIAGNOSIS — Z87828 Personal history of other (healed) physical injury and trauma: Secondary | ICD-10-CM | POA: Diagnosis not present

## 2015-07-14 DIAGNOSIS — K921 Melena: Secondary | ICD-10-CM | POA: Insufficient documentation

## 2015-07-14 DIAGNOSIS — I2510 Atherosclerotic heart disease of native coronary artery without angina pectoris: Secondary | ICD-10-CM | POA: Diagnosis not present

## 2015-07-14 DIAGNOSIS — R1084 Generalized abdominal pain: Secondary | ICD-10-CM | POA: Diagnosis not present

## 2015-07-14 DIAGNOSIS — R45851 Suicidal ideations: Secondary | ICD-10-CM | POA: Diagnosis present

## 2015-07-14 DIAGNOSIS — Z87891 Personal history of nicotine dependence: Secondary | ICD-10-CM | POA: Diagnosis not present

## 2015-07-14 DIAGNOSIS — Z951 Presence of aortocoronary bypass graft: Secondary | ICD-10-CM | POA: Diagnosis not present

## 2015-07-14 DIAGNOSIS — Z79899 Other long term (current) drug therapy: Secondary | ICD-10-CM | POA: Insufficient documentation

## 2015-07-14 DIAGNOSIS — E669 Obesity, unspecified: Secondary | ICD-10-CM | POA: Insufficient documentation

## 2015-07-14 DIAGNOSIS — F419 Anxiety disorder, unspecified: Secondary | ICD-10-CM | POA: Diagnosis not present

## 2015-07-14 DIAGNOSIS — K5900 Constipation, unspecified: Secondary | ICD-10-CM | POA: Insufficient documentation

## 2015-07-14 DIAGNOSIS — E119 Type 2 diabetes mellitus without complications: Secondary | ICD-10-CM | POA: Insufficient documentation

## 2015-07-14 DIAGNOSIS — R5383 Other fatigue: Secondary | ICD-10-CM | POA: Insufficient documentation

## 2015-07-14 DIAGNOSIS — Z794 Long term (current) use of insulin: Secondary | ICD-10-CM | POA: Insufficient documentation

## 2015-07-14 DIAGNOSIS — R140 Abdominal distension (gaseous): Secondary | ICD-10-CM | POA: Diagnosis not present

## 2015-07-14 DIAGNOSIS — F39 Unspecified mood [affective] disorder: Secondary | ICD-10-CM | POA: Diagnosis present

## 2015-07-14 DIAGNOSIS — R630 Anorexia: Secondary | ICD-10-CM | POA: Diagnosis not present

## 2015-07-14 LAB — CBC WITH DIFFERENTIAL/PLATELET
Basophils Absolute: 0 10*3/uL (ref 0.0–0.1)
Basophils Relative: 0 %
EOS ABS: 0.1 10*3/uL (ref 0.0–0.7)
EOS PCT: 1 %
HCT: 39.6 % (ref 39.0–52.0)
Hemoglobin: 13.7 g/dL (ref 13.0–17.0)
LYMPHS ABS: 2.5 10*3/uL (ref 0.7–4.0)
LYMPHS PCT: 32 %
MCH: 30.1 pg (ref 26.0–34.0)
MCHC: 34.6 g/dL (ref 30.0–36.0)
MCV: 87 fL (ref 78.0–100.0)
MONO ABS: 0.6 10*3/uL (ref 0.1–1.0)
MONOS PCT: 8 %
Neutro Abs: 4.6 10*3/uL (ref 1.7–7.7)
Neutrophils Relative %: 59 %
PLATELETS: 282 10*3/uL (ref 150–400)
RBC: 4.55 MIL/uL (ref 4.22–5.81)
RDW: 13.4 % (ref 11.5–15.5)
WBC: 7.8 10*3/uL (ref 4.0–10.5)

## 2015-07-14 LAB — COMPREHENSIVE METABOLIC PANEL
ALT: 14 U/L — AB (ref 17–63)
ANION GAP: 10 (ref 5–15)
AST: 18 U/L (ref 15–41)
Albumin: 3.6 g/dL (ref 3.5–5.0)
Alkaline Phosphatase: 93 U/L (ref 38–126)
BUN: 22 mg/dL — ABNORMAL HIGH (ref 6–20)
CHLORIDE: 103 mmol/L (ref 101–111)
CO2: 25 mmol/L (ref 22–32)
CREATININE: 1.18 mg/dL (ref 0.61–1.24)
Calcium: 9.2 mg/dL (ref 8.9–10.3)
GFR, EST NON AFRICAN AMERICAN: 60 mL/min — AB (ref >60)
Glucose, Bld: 271 mg/dL — ABNORMAL HIGH (ref 65–99)
Potassium: 4.2 mmol/L (ref 3.5–5.1)
SODIUM: 138 mmol/L (ref 135–145)
Total Bilirubin: 0.7 mg/dL (ref 0.3–1.2)
Total Protein: 6.7 g/dL (ref 6.5–8.1)

## 2015-07-14 LAB — URINALYSIS, ROUTINE W REFLEX MICROSCOPIC
GLUCOSE, UA: NEGATIVE mg/dL
HGB URINE DIPSTICK: NEGATIVE
KETONES UR: 15 mg/dL — AB
LEUKOCYTES UA: NEGATIVE
Nitrite: NEGATIVE
PROTEIN: NEGATIVE mg/dL
Specific Gravity, Urine: 1.025 (ref 1.005–1.030)
pH: 5 (ref 5.0–8.0)

## 2015-07-14 LAB — I-STAT TROPONIN, ED: TROPONIN I, POC: 0.01 ng/mL (ref 0.00–0.08)

## 2015-07-14 LAB — RAPID URINE DRUG SCREEN, HOSP PERFORMED
Amphetamines: NOT DETECTED
BARBITURATES: NOT DETECTED
Benzodiazepines: NOT DETECTED
Cocaine: NOT DETECTED
Opiates: NOT DETECTED
Tetrahydrocannabinol: NOT DETECTED

## 2015-07-14 LAB — CBG MONITORING, ED: Glucose-Capillary: 229 mg/dL — ABNORMAL HIGH (ref 65–99)

## 2015-07-14 LAB — LIPASE, BLOOD: LIPASE: 16 U/L (ref 11–51)

## 2015-07-14 LAB — ETHANOL

## 2015-07-14 MED ORDER — IOHEXOL 300 MG/ML  SOLN: 80.0000 mL | Freq: Once | INTRAMUSCULAR | Status: DC | PRN

## 2015-07-14 MED ORDER — LORAZEPAM 1 MG PO TABS
0.5000 mg | ORAL_TABLET | Freq: Once | ORAL | Status: DC
  Filled 2015-07-14: qty 1

## 2015-07-14 MED ORDER — SODIUM CHLORIDE 0.9 % IV BOLUS (SEPSIS): 1000.0000 mL | Freq: Once | INTRAVENOUS | Status: DC

## 2015-07-14 MED ORDER — LORAZEPAM 2 MG/ML IJ SOLN
0.5000 mg | Freq: Once | INTRAMUSCULAR | Status: DC
  Filled 2015-07-14: qty 1

## 2015-07-14 NOTE — ED Notes (Signed)
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 They report general decline in health for several months and him not taking care of himself in a "giving up" way. Has not taken any medications in months, weight loss for several months. He did not want to come to facility, but told EMS he had suicidal thoughts, so they brought him. He states he has no plan and no weapons at home, just doesn't want to be in misery anymore. He reports rectal pain and bleeding after straining to have a BM; reports no BM in several weeks, but also has not been eating or drinking much. EMS reports the living conditions were "hoarder-like" and the steps going up to his part of the house were very unsafe and dilapidated. EMS concerned for his safety in returning to those living conditions.

## 2015-07-14 NOTE — ED Notes (Signed)
Patient returned from X-ray 

## 2015-07-14 NOTE — ED Provider Notes (Signed)
CSN: [id number]     Arrival date & time 07/14/15  1714 History   First MD Initiated Contact with Patient 07/14/15 1716     Chief Complaint  Patient presents with  . Fatigue  . Suicidal     (Consider location/radiation/quality/duration/timing/severity/associated sxs/prior Treatment) HPI Patient presents by EMS. Neighbors called due to concern for patient not taking care of himself. Patient states he is hoping that "it all ends". He has no specific suicidal plans. Patient has been out of his medications for multiple months. No previous suicidal attempts. Denies any guns in the house. Generalized fatigue and complains of constipation. Denies any alcohol or drug use. Past Medical History  Diagnosis Date  . Diabetes mellitus without complication ([place])   . Coronary artery disease   . High cholesterol   . Anxiety   . Burn   . Dyslipidemia   . Obesity   . History of smoking    Past Surgical History  Procedure Laterality Date  . Coronary artery bypass graft     Family History  Problem Relation Age of Onset  . Renal Disease Mother   . Cancer Father     Pancreatic   Social History  Substance Use Topics  . Smoking status: Former Research scientist (life sciences)  . Smokeless tobacco: Never Used  . Alcohol Use: No    Review of Systems  Constitutional: Positive for activity change, appetite change and fatigue. Negative for fever and chills.  Respiratory: Negative for cough and shortness of breath.   Cardiovascular: Negative for chest pain.  Gastrointestinal: Positive for abdominal pain, constipation, blood in stool and abdominal distention. Negative for nausea and vomiting.  Genitourinary: Negative for dysuria, frequency and flank pain.  Musculoskeletal: Negative for back pain and neck pain.  Skin: Negative for rash and wound.  Neurological: Positive for weakness (generalized). Negative for dizziness, light-headedness, numbness and headaches.  Psychiatric/Behavioral: Positive for suicidal ideas and dysphoric  mood.  All other systems reviewed and are negative.     Allergies  Review of patient's allergies indicates no known allergies.  Home Medications   Prior to Admission medications   Medication Sig Start Date End Date Taking? Authorizing Provider  diazepam (VALIUM) 5 MG tablet Take 5 mg by mouth every 12 (twelve) hours as needed for anxiety. Reported on 07/14/2015    Historical Provider, MD  insulin aspart protamine- aspart (NOVOLOG MIX 70/30) (70-30) 100 UNIT/ML injection Inject 30-40 Units into the skin daily with breakfast. Reported on 07/14/2015    Historical Provider, MD  ipratropium (ATROVENT) 0.06 % nasal spray Place 2 sprays into both nostrils 4 (four) times daily. 08/31/14   Billy Fischer, MD  mirtazapine (REMERON SOL-TAB) 15 MG disintegrating tablet Take 1 tablet (15 mg total) by mouth at bedtime. 08/31/14   Billy Fischer, MD  senna-docusate (SENOKOT-S) 8.6-50 MG per tablet Take 1 tablet by mouth daily. 03/02/14   Ripley Fraise, MD  traZODone (DESYREL) 50 MG tablet Take 50 mg by mouth at bedtime. Reported on 07/14/2015    Historical Provider, MD   BP 118/68 mmHg  Pulse 92  Temp(Src) 98.3 F (36.8 C) (Oral)  Resp 18  Ht 5\' 6"  (1.676 m)  SpO2 94% Physical Exam  Constitutional: He is oriented to person, place, and time. He appears well-developed and well-nourished. No distress.  Disheveled appearance. Wearing old hospital socks.  HENT:  Head: Normocephalic and atraumatic.  Mouth/Throat: Oropharynx is clear and moist. No oropharyngeal exudate.  Eyes: EOM are normal. Pupils are equal, round, and reactive  to light.  Neck: Normal range of motion. Neck supple.  NO meningismus  Cardiovascular: Normal rate and regular rhythm.  Exam reveals no gallop and no friction rub.   No murmur heard. Pulmonary/Chest: Effort normal and breath sounds normal. No respiratory distress. He has no wheezes. He has no rales. He exhibits no tenderness.  Abdominal: Soft. Bowel sounds are normal. He exhibits  distension. He exhibits no mass. There is tenderness. There is no rebound and no guarding.  Mild diffuse abdominal tenderness to palpation. No rebound or guarding.  Musculoskeletal: Normal range of motion. He exhibits no edema or tenderness.  No lower extremity swelling or pain.  Neurological: He is alert and oriented to person, place, and time.  Moving all extremities. Sensation intact.  Skin: Skin is warm and dry. No rash noted. No erythema.  Psychiatric: He has a normal mood and affect. His behavior is normal.  Patient with flat affect. Poor eye contact. Making vague suicidal references. Denies specific plan.  Nursing note and vitals reviewed.   ED Course  Procedures (including critical care time) Labs Review Labs Reviewed  COMPREHENSIVE METABOLIC PANEL - Abnormal; Notable for the following:    Glucose, Bld 271 (*)    BUN 22 (*)    ALT 14 (*)    GFR calc non Af Amer 60 (*)    All other components within normal limits  URINALYSIS, ROUTINE W REFLEX MICROSCOPIC (NOT AT [facility]) - Abnormal; Notable for the following:    Color, Urine AMBER (*)    APPearance CLOUDY (*)    Bilirubin Urine MODERATE (*)    Ketones, ur 15 (*)    All other components within normal limits  CBG MONITORING, ED - Abnormal; Notable for the following:    Glucose-Capillary 229 (*)    All other components within normal limits  CBC WITH DIFFERENTIAL/PLATELET  ETHANOL  URINE RAPID DRUG SCREEN, HOSP PERFORMED  LIPASE, BLOOD  I-STAT TROPOININ, ED    Imaging Review Dg Abd Acute W/chest  07/14/2015  CLINICAL DATA:  Abdominal pain and weight loss. General decline in health for months with limited p.o. intake. EXAM: DG ABDOMEN ACUTE W/ 1V CHEST COMPARISON:  CT abdomen/pelvis 03/18/2014 FINDINGS: Patient is post median sternotomy. Heart at the upper limits normal in size. Peripheral and basilar honeycombing consistent with fibrosis. No superimposed consolidation. No free intra-abdominal air. No dilated bowel loops to  suggest obstruction. Small to moderate volume of colonic stool. No radiopaque calculi. Pelvis from the mid iliac wings and caudally not included in the field of views. No acute osseous abnormalities are seen. IMPRESSION: 1. Normal bowel gas pattern. No free air. Pelvis excluded from the field of view. 2. Pulmonary fibrosis. Electronically Signed   By: Jeb Levering M.D.   On: 07/14/2015 18:18   I have personally reviewed and evaluated these images and lab results as part of my medical decision-making.   EKG Interpretation   Date/Time:  Sunday July 14 2015 17:18:03 EST Ventricular Rate:  93 PR Interval:  190 QRS Duration: 123 QT Interval:  392 QTC Calculation: 488 R Axis:   -45 Text Interpretation:  Sinus rhythm Probable left atrial enlargement Left  bundle branch block ED PHYSICIAN INTERPRETATION AVAILABLE IN [facility] Confirmed by TEST, Record ([id number]) on 07/15/2015 6:45:06 AM      MDM   Final diagnoses:  None   diagnosis: Suicidal ideation  Patient with disheveled appearance. Vague suicidality. Refused abdominal CT scan. Abdominal exam is benign. There is no rebound or guarding.  Plain x-rays without any acute abnormality. We'll place like old orders and have TTS consulted.  Behavioral health has recommended inpatient treatment. Holding orders placed.     Julianne Rice, MD 07/15/15 2231

## 2015-07-14 NOTE — BH Assessment (Addendum)
Tele Assessment Note   [name] is an 75 y.o. male, Caucasian who presents unaccompanied to [facility] reporting multiple physical complaints and saying he wants to die. 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 They report general decline in health for several months and him not taking care of himself in a "giving up" way. Has not taken any medications in months, weight loss for several months. He did not want to come to facility, but told EMS he had suicidal thoughts, so they brought him. He states he has no plan and no weapons at home, just doesn't want to be in misery anymore. He reports rectal pain and bleeding after straining to have a BM; reports no BM in several weeks, but also has not been eating or drinking much. EMS reports the living conditions were "hoarder-like" and the steps going up to his part of the house were very unsafe and dilapidated. EMS concerned for his safety in returning to those living conditions."  Pt states that he tried to eat food given to him in the ED and now he is choked and can't swallow. Pt repeatedly apologizes about accepting food, saying he should not have tried to eat. Pt states his throat burns when he tries to drink water.  He says "I don't have a rectum" and that he bleeds when he tried to have a bowel movement. He states he has been awake for days because he cannot get comfortable. He says he is unsteady on his feet, will often fall, and can barely walk across his apartment. Pt appear thin and says he has lost a lot of weight. Pt says he doesn't want to live anymore and wants someone "to put me out of my misery." He states he cannot focus. He states he is hearing voices and hearing the wind near his apartment. He denies homicidal ideation. He denies alcohol or substance abuse.  Pt reports he lives alone. He cannot identify anyone in his life who is  supportive, stating that "everyone has moved on." He reports he has a history of mental health treatment but is unable to provide any details.   Pt states "I can't talk anymore. I'm sorry" and stopped responding to questions.  Pt is disheveled, thin and dressed in hospital scrubs. He is alert and oriented to person place and situation but not date or year.  Speech is soft. Normal motor behavior appears within normal limits. Eye contact is fair. Pt's mood is depressed and helpless and affect is congruent with mood. Thought process is coherent but Pt appears to have poor concentration.   Diagnosis: Unspecified Psychotic Disorder  Past Medical History:  Past Medical History  Diagnosis Date  . Diabetes mellitus without complication ([place])   . Coronary artery disease   . High cholesterol   . Anxiety   . Burn   . Dyslipidemia   . Obesity   . History of smoking     Past Surgical History  Procedure Laterality Date  . Coronary artery bypass graft      Family History:  Family History  Problem Relation Age of Onset  . Renal Disease Mother   . Cancer Father     Pancreatic    Social History:  reports that he has quit smoking. He has never used smokeless tobacco. He reports that he does not drink alcohol or  use illicit drugs.  Additional Social History:  Alcohol / Drug Use Pain Medications: None Prescriptions: Not taking Over the Counter: None History of alcohol / drug use?: No history of alcohol / drug abuse Longest period of sobriety (when/how long): NA  CIWA: CIWA-Ar BP: 139/70 mmHg Pulse Rate: 92 COWS:    PATIENT STRENGTHS: (choose at least two) Average or above average intelligence Communication skills General fund of knowledge  Allergies: No Known Allergies  Home Medications:  (Not in a hospital admission)  OB/GYN Status:  No LMP for male patient.  General Assessment Data Location of Assessment: [facility] ED TTS Assessment: In system Is this a Tele or Face-to-Face  Assessment?: Tele Assessment Is this an Initial Assessment or a Re-assessment for this encounter?: Initial Assessment Marital status: Single Maiden name: NA Is patient pregnant?: No Pregnancy Status: No Living Arrangements: Alone Can pt return to current living arrangement?: Yes Admission Status: Voluntary Is patient capable of signing voluntary admission?: Yes Referral Source: Self/Family/Friend Insurance type: [facility] Medicare     Crisis Care Plan Living Arrangements: Alone Legal Guardian: Other: (None) Name of Psychiatrist: None Name of Therapist: None  Education Status Is patient currently in school?: No Current Grade: NA Highest grade of school patient has completed: NA Name of school: NA Contact person: NA  Risk to self with the past 6 months Suicidal Ideation: Yes-Currently Present Has patient been a risk to self within the past 6 months prior to admission? : Yes Suicidal Intent: Yes-Currently Present Has patient had any suicidal intent within the past 6 months prior to admission? : Yes Is patient at risk for suicide?: Yes Suicidal Plan?: No Has patient had any suicidal plan within the past 6 months prior to admission? : No Access to Means: No What has been your use of drugs/alcohol within the last 12 months?: Pt denies Previous Attempts/Gestures: No How many times?: 0 Other Self Harm Risks: Pt not eating, drinking or caring for himself Triggers for Past Attempts: None known Intentional Self Injurious Behavior: None Family Suicide History: Unknown Recent stressful life event(s): Recent negative physical changes Persecutory voices/beliefs?: No Depression: Yes Depression Symptoms: Despondent, Insomnia, Isolating, Fatigue, Loss of interest in usual pleasures, Feeling worthless/self pity, Guilt Substance abuse history and/or treatment for substance abuse?: No Suicide prevention information given to non-admitted patients: Not applicable  Risk to Others within the past  6 months Homicidal Ideation: No Does patient have any lifetime risk of violence toward others beyond the six months prior to admission? : No Thoughts of Harm to Others: No Current Homicidal Intent: No Current Homicidal Plan: No Access to Homicidal Means: No Identified Victim: None History of harm to others?: No Assessment of Violence: None Noted Violent Behavior Description: Pt denies Does patient have access to weapons?: No Criminal Charges Pending?: No Does patient have a court date: No Is patient on probation?: No  Psychosis Hallucinations: Auditory (See assessment notes) Delusions: None noted  Mental Status Report Appearance/Hygiene: Disheveled Eye Contact: Fair Motor Activity: Unremarkable Speech: Slow Level of Consciousness: Alert Mood: Depressed, Helpless Affect: Depressed Anxiety Level: Minimal Thought Processes: Coherent Judgement: Impaired Orientation: Person, Place, Situation Obsessive Compulsive Thoughts/Behaviors: None  Cognitive Functioning Concentration: Decreased Memory: Recent Intact, Remote Intact IQ: Average Insight: Fair Impulse Control: Fair Appetite: Poor Weight Loss: 30 Weight Gain: 0 Sleep: Decreased Total Hours of Sleep: 1 Vegetative Symptoms: None  ADLScreening [facility] Assessment Services) Patient's cognitive ability adequate to safely complete daily activities?: Yes Patient able to express need for assistance with ADLs?: Yes Independently  performs ADLs?: No  Prior Inpatient Therapy Prior Inpatient Therapy: Yes Prior Therapy Dates: Unknown Prior Therapy Facilty/Provider(s): Unknown Reason for Treatment: Unknown  Prior Outpatient Therapy Prior Outpatient Therapy: Yes Prior Therapy Dates: Unknown Prior Therapy Facilty/Provider(s): Unknown Reason for Treatment: Unknown Does patient have an ACCT team?: No Does patient have Intensive In-House Services?  : No Does patient have [facility] services? : No Does patient have P4CC services?:  No  ADL Screening (condition at time of admission) Patient's cognitive ability adequate to safely complete daily activities?: Yes Patient able to express need for assistance with ADLs?: Yes Independently performs ADLs?: No       Abuse/Neglect Assessment (Assessment to be complete while patient is alone) Physical Abuse: Denies Verbal Abuse: Denies Sexual Abuse: Denies Exploitation of patient/patient's resources: Denies Self-Neglect: Denies     Regulatory affairs officer (For Healthcare) Does patient have an advance directive?: No Would patient like information on creating an advanced directive?: No - patient declined information    Additional Information 1:1 In Past 12 Months?: No CIRT Risk: No Elopement Risk: No Does patient have medical clearance?: No     Disposition: Lavell Luster, AC at [facility], confirmed adult unit is currently at capacity. Gave clinical report to Arlester Marker, NP who said Pt meets criteria for inpatient psychiatric treatment and recommends transfer to a geriatric-psychiatry unit when Pt is medically cleared. Notified Dr. Julianne Rice of recommendation.   Disposition Initial Assessment Completed for this Encounter: Yes Disposition of Patient: Other dispositions Other disposition(s): Other (Comment)  Evelena Peat, [facility], [facility], [facility] Triage Specialist [phone]  Anson Fret, Orpah Greek 07/14/2015 9:40 PM

## 2015-07-14 NOTE — ED Notes (Signed)
Entered room to administer medications. Patient had previously requested a dose of "something to settle my nerves". Upon informing patient of plan of care, he responded with off-topic comments. Redirected patient to matter of medications and he became upset stating "nothing will help" and "I am knocking on deaths door". Attempted to reassure patient that his vital signs are currently stable and adequate; also brought his attention to the fact that he is able to move, speak, and breath freely; but patient continued to assert that he is actively dying and began breathing quickly. Patient noted to have a very negative outlook on all potentially beneficial interventions, and would not allow this RN to initiate any supportive care. Offered numerous things and finally patient agreed to trying some food. Provided patient with Sandwich and Water per preference. Once again patient took a negative stance on his ability to eat, stating "there is no way I can eat with my stomach like this". Encouraged patient to eat and assisted with setup. As this RN was leaving the room, patient noted to be muttering words at a low volume.

## 2015-07-14 NOTE — ED Notes (Signed)
Patient transported to X-ray 

## 2015-07-14 NOTE — ED Notes (Signed)
Contact info for neighbors/tenants: Mikki Santee [place] Jeneen Rinks [phone]

## 2015-07-14 NOTE — ED Notes (Signed)
Pt presents to [facility] in wine scrubs and yellow non slip socks. Pt is withdrawn in conversation, appears exhausted, depressed, and disheveled. Pt requesting something to eat, provided jello. At first pt declined sandwich. Pt also requesting "something to help me sleep." Obtained verbal ativan order, pt then refused pill, stating "this is a bottomless pit," pointing to stomach, refusing to take a pill at this time. Pt then asking for more food and willing accepted sandwich and milk.

## 2015-07-15 DIAGNOSIS — F22 Delusional disorders: Secondary | ICD-10-CM

## 2015-07-15 DIAGNOSIS — F39 Unspecified mood [affective] disorder: Secondary | ICD-10-CM | POA: Diagnosis present

## 2015-07-15 MED ORDER — LORAZEPAM 1 MG PO TABS
0.5000 mg | ORAL_TABLET | Freq: Once | ORAL | Status: DC
  Filled 2015-07-15: qty 1

## 2015-07-15 NOTE — ED Notes (Signed)
Pt states "I fell on the floor and I couldn't get up" sitter at bedside entire time sitter did not witness pt fall at anytime pt wearing " fall risk" and fall risk socks

## 2015-07-15 NOTE — ED Notes (Signed)
Sitter helped bathe pt
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
## 2015-07-15 NOTE — BHH Counselor (Signed)
Pt recommended for IP treatment.  Pt's information sent to the following:  [place], [place], [facility], [facility] Triage Specialist [facility]

## 2015-07-15 NOTE — ED Notes (Signed)
Pt overheard pt speaking to self in room. Pt c/o difficulty swallowing and "it's been a long time since I've ate anything at home. It's just been so hard to eat, I keep losing weight, I'm all skin and bones." Pt has had meals delivered to him by neighbors, reports feeling too weak to ambulate at home and has been primarily staying in the bed. Pt states he's hearing voices and feeling a "cold wind." Pt offered blanket, declined blanket, "because I feel numb all over and putting something on me makes it worse." Pt refers to hearing voices "from people who live below me." Continues to apologize for "bothering" RN; informed pt that staff is here to help him. Appears hopeless and depressed

## 2015-07-15 NOTE — ED Notes (Addendum)
Patient was given a snack and drink. A regular diet ordered for lunch.

## 2015-07-15 NOTE — ED Notes (Signed)
Lugina Pannel from [facility] in [place], [place] called about pt.  [name] they are looking into having him wanted to know weight, commitment status.

## 2015-07-15 NOTE — Consult Note (Signed)
[facility] Face-to-Face Psychiatry Consult   Reason for Consult:  Psychosis and suicide ideation Referring Physician:  EDP Patient Identification: [name] MRN:  [id number] Principal Diagnosis: Other affective psychosis Diagnosis:   Patient Active Problem List   Diagnosis Date Noted  . 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 male patient admitted with psychosis and suicide ideation.  HPI:  [name] is an 75 y.o. male, Caucasian who presents unaccompanied to [facility] reporting multiple physical complaints and saying he wants to die/suicidal ideation and wanted to give up his life but no intention or plan besides noncompliant with the irrigations, food and water. Patient is a poor historian during this evaluation.  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 They report general decline in health for several months and him not taking care of himself in a "giving up" way. Has not taken any medications in months, weight loss for several months. He did not want to come to facility, but told EMS he had suicidal thoughts, so they brought him. He states he has no plan and no weapons at home, just doesn't want to be in misery anymore. He reports rectal pain and bleeding after straining to have a BM; reports no BM in several weeks, but also has not been eating or drinking much. EMS reports the living conditions were "hoarder-like" and the steps going up to his part of the house were very unsafe and dilapidated. EMS concerned for his safety in returning to those living conditions."  Patient stated that he has given his personal things and identity card to his friends who is living downstairs. Patient reportedly becoming isolated, withdrawn, disturbed sleep, disturbed appetite, staying in his home 6-8 weeks  without moving outside of his room. They since become delusional saying that he has been suffering with severe medical problems with his stomach, and rectum and could not survive and feels he is going to die instead of giving assurance from the medical staff. Patient denies previous history of mental illness artery outpatient or inpatient treatments. Patient reportedly has no family members, never married, no children. Patient has 2 brothers but they moved on with their life not in contact with him any longer. Patient reported his friends also not in touch with him for long time and has no phone numbers to contact them. Reportedly patient has trouble eating his food because he choked and can't swallow. Patient suddenly stopped talking or toxic with low voice and said "I'm losing my voice". Pt reports he lives alone. He cannot identify anyone in his life who is supportive, stating that "everyone has moved on." He reports he has a history of mental health treatment but is unable to provide any details. Patient denied suicidal/homicidal ideation intention or plans. Patient trying to sit up in his bed without much success.  Past Psychiatric History: Denied past history of psychiatric illness or treatment   Risk to Self: Suicidal Ideation: Yes-Currently Present Suicidal Intent: Yes-Currently Present Is patient at risk for suicide?: Yes Suicidal Plan?: No Access to Means: No What has been your use of drugs/alcohol within the last 12 months?: Pt denies How many times?: 0 Other Self Harm Risks: Pt not eating, drinking or caring for himself Triggers for Past Attempts: None known Intentional Self  Injurious Behavior: None Risk to Others: Homicidal Ideation: No Thoughts of Harm to Others: No Current Homicidal Intent: No Current Homicidal Plan: No Access to Homicidal Means: No Identified Victim: None History of harm to others?: No Assessment of Violence: None Noted Violent Behavior Description: Pt  denies Does patient have access to weapons?: No Criminal Charges Pending?: No Does patient have a court date: No Prior Inpatient Therapy: Prior Inpatient Therapy: Yes Prior Therapy Dates: Unknown Prior Therapy Facilty/Provider(s): Unknown Reason for Treatment: Unknown Prior Outpatient Therapy: Prior Outpatient Therapy: Yes Prior Therapy Dates: Unknown Prior Therapy Facilty/Provider(s): Unknown Reason for Treatment: Unknown Does patient have an ACCT team?: No Does patient have Intensive In-House Services?  : No Does patient have [facility] services? : No Does patient have P4CC services?: No  Past Medical History:  Past Medical History  Diagnosis Date  . Diabetes mellitus without complication ([place])   . Coronary artery disease   . High cholesterol   . Anxiety   . Burn   . Dyslipidemia   . Obesity   . History of smoking     Past Surgical History  Procedure Laterality Date  . Coronary artery bypass graft     Family History:  Family History  Problem Relation Age of Onset  . Renal Disease Mother   . Cancer Father     Pancreatic   Family Psychiatric  History: Unknown Social History:  History  Alcohol Use No     History  Drug Use No    Social History   Social History  . Marital Status: Single    Spouse Name: N/A  . Number of Children: N/A  . Years of Education: N/A   Social History Main Topics  . Smoking status: Former Research scientist (life sciences)  . Smokeless tobacco: Never Used  . Alcohol Use: No  . Drug Use: No  . Sexual Activity: Not Asked   Other Topics Concern  . None   Social History Narrative   Additional Social History:    Pain Medications: None Prescriptions: Not taking Over the Counter: None History of alcohol / drug use?: No history of alcohol / drug abuse Longest period of sobriety (when/how long): NA                     Allergies:  No Known Allergies  Labs:  Results for orders placed or performed during the hospital encounter of 07/14/15 (from the  past 48 hour(s))  CBG monitoring, ED     Status: Abnormal   Collection Time: 07/14/15  5:30 PM  Result Value Ref Range   Glucose-Capillary 229 (H) 65 - 99 mg/dL  CBC with Differential/Platelet     Status: None   Collection Time: 07/14/15  5:37 PM  Result Value Ref Range   WBC 7.8 4.0 - 10.5 K/uL   RBC 4.55 4.22 - 5.81 MIL/uL   Hemoglobin 13.7 13.0 - 17.0 g/dL   HCT 39.6 39.0 - 52.0 %   MCV 87.0 78.0 - 100.0 fL   MCH 30.1 26.0 - 34.0 pg   MCHC 34.6 30.0 - 36.0 g/dL   RDW 13.4 11.5 - 15.5 %   Platelets 282 150 - 400 K/uL   Neutrophils Relative % 59 %   Neutro Abs 4.6 1.7 - 7.7 K/uL   Lymphocytes Relative 32 %   Lymphs Abs 2.5 0.7 - 4.0 K/uL   Monocytes Relative 8 %   Monocytes Absolute 0.6 0.1 - 1.0 K/uL   Eosinophils Relative 1 %   Eosinophils  Absolute 0.1 0.0 - 0.7 K/uL   Basophils Relative 0 %   Basophils Absolute 0.0 0.0 - 0.1 K/uL  Comprehensive metabolic panel     Status: Abnormal   Collection Time: 07/14/15  5:37 PM  Result Value Ref Range   Sodium 138 135 - 145 mmol/L   Potassium 4.2 3.5 - 5.1 mmol/L   Chloride 103 101 - 111 mmol/L   CO2 25 22 - 32 mmol/L   Glucose, Bld 271 (H) 65 - 99 mg/dL   BUN 22 (H) 6 - 20 mg/dL   Creatinine, Ser 1.18 0.61 - 1.24 mg/dL   Calcium 9.2 8.9 - 10.3 mg/dL   Total Protein 6.7 6.5 - 8.1 g/dL   Albumin 3.6 3.5 - 5.0 g/dL   AST 18 15 - 41 U/L   ALT 14 (L) 17 - 63 U/L   Alkaline Phosphatase 93 38 - 126 U/L   Total Bilirubin 0.7 0.3 - 1.2 mg/dL   GFR calc non Af Amer 60 (L) >60 mL/min   GFR calc Af Amer >60 >60 mL/min    Comment: (NOTE) The eGFR has been calculated using the CKD EPI equation. This calculation has not been validated in all clinical situations. eGFR's persistently <60 mL/min signify possible Chronic Kidney Disease.    Anion gap 10 5 - 15  I-stat troponin, ED     Status: None   Collection Time: 07/14/15  5:37 PM  Result Value Ref Range   Troponin i, poc 0.01 0.00 - 0.08 ng/mL   Comment 3            Comment: Due  to the release kinetics of cTnI, a negative result within the first hours of the onset of symptoms does not rule out myocardial infarction with certainty. If myocardial infarction is still suspected, repeat the test at appropriate intervals.   Ethanol     Status: None   Collection Time: 07/14/15  5:37 PM  Result Value Ref Range   Alcohol, Ethyl (B) <5 <5 mg/dL    Comment:        LOWEST DETECTABLE LIMIT FOR SERUM ALCOHOL IS 5 mg/dL FOR MEDICAL PURPOSES ONLY   Lipase, blood     Status: None   Collection Time: 07/14/15  5:37 PM  Result Value Ref Range   Lipase 16 11 - 51 U/L  Urinalysis, Routine w reflex microscopic (not at [facility])     Status: Abnormal   Collection Time: 07/14/15  7:26 PM  Result Value Ref Range   Color, Urine AMBER (A) YELLOW    Comment: BIOCHEMICALS MAY BE AFFECTED BY COLOR   APPearance CLOUDY (A) CLEAR   Specific Gravity, Urine 1.025 1.005 - 1.030   pH 5.0 5.0 - 8.0   Glucose, UA NEGATIVE NEGATIVE mg/dL   Hgb urine dipstick NEGATIVE NEGATIVE   Bilirubin Urine MODERATE (A) NEGATIVE   Ketones, ur 15 (A) NEGATIVE mg/dL   Protein, ur NEGATIVE NEGATIVE mg/dL   Nitrite NEGATIVE NEGATIVE   Leukocytes, UA NEGATIVE NEGATIVE    Comment: MICROSCOPIC NOT DONE ON URINES WITH NEGATIVE PROTEIN, BLOOD, LEUKOCYTES, NITRITE, OR GLUCOSE <1000 mg/dL.  Urine rapid drug screen (hosp performed)     Status: None   Collection Time: 07/14/15  7:26 PM  Result Value Ref Range   Opiates NONE DETECTED NONE DETECTED   Cocaine NONE DETECTED NONE DETECTED   Benzodiazepines NONE DETECTED NONE DETECTED   Amphetamines NONE DETECTED NONE DETECTED   Tetrahydrocannabinol NONE DETECTED NONE DETECTED   Barbiturates NONE DETECTED  NONE DETECTED    Comment:        DRUG SCREEN FOR MEDICAL PURPOSES ONLY.  IF CONFIRMATION IS NEEDED FOR ANY PURPOSE, NOTIFY LAB WITHIN 5 DAYS.        LOWEST DETECTABLE LIMITS FOR URINE DRUG SCREEN Drug Class       Cutoff (ng/mL) Amphetamine      1000 Barbiturate       200 Benzodiazepine   782 Tricyclics       423 Opiates          300 Cocaine          300 THC              50     Current Facility-Administered Medications  Medication Dose Route Frequency Provider Last Rate Last Dose  . iohexol (OMNIPAQUE) 300 MG/ML solution 80 mL  80 mL Intravenous Once PRN Julianne Rice, MD      . LORazepam (ATIVAN) injection 0.5 mg  0.5 mg Intravenous Once Julianne Rice, MD   0.5 mg at 07/14/15 2057  . LORazepam (ATIVAN) tablet 0.5 mg  0.5 mg Oral Once Julianne Rice, MD   Stopped at 07/14/15 2350  . sodium chloride 0.9 % bolus 1,000 mL  1,000 mL Intravenous Once Julianne Rice, MD   1,000 mL at 07/14/15 2057   Current Outpatient Prescriptions  Medication Sig Dispense Refill  . diazepam (VALIUM) 5 MG tablet Take 5 mg by mouth every 12 (twelve) hours as needed for anxiety. Reported on 07/14/2015    . insulin aspart protamine- aspart (NOVOLOG MIX 70/30) (70-30) 100 UNIT/ML injection Inject 30-40 Units into the skin daily with breakfast. Reported on 07/14/2015    . ipratropium (ATROVENT) 0.06 % nasal spray Place 2 sprays into both nostrils 4 (four) times daily. 15 mL 1  . mirtazapine (REMERON SOL-TAB) 15 MG disintegrating tablet Take 1 tablet (15 mg total) by mouth at bedtime. 30 tablet 0  . senna-docusate (SENOKOT-S) 8.6-50 MG per tablet Take 1 tablet by mouth daily. 15 tablet 0  . traZODone (DESYREL) 50 MG tablet Take 50 mg by mouth at bedtime. Reported on 07/14/2015      Musculoskeletal: Strength & Muscle Tone: decreased Gait & Station: unable to stand Patient leans: N/A  Psychiatric Specialty Exam: ROS complaining about stomach illness, constipation, poor appetite, poor sleep and feels he is dying. Patient seems to be delusional about medical illness. Patient has depressed mood and constricted affect.   Blood pressure 109/84, pulse 87, temperature 97.9 F (36.6 C), temperature source Oral, resp. rate 16, height '5\' 6"'$  (1.676 m), SpO2 100 %.There is no weight on  file to calculate BMI.  General Appearance: Disheveled and Guarded  Eye Sport and exercise psychologist::  Fair  Speech:  Blocked, Clear and Coherent and Slow  Volume:  Decreased  Mood:  Anxious and Depressed  Affect:  Constricted and Depressed  Thought Process:  Coherent and Irrelevant  Orientation:  Full (Time, Place, and Person)  Thought Content:  Delusions and Rumination  Suicidal Thoughts:  No  Homicidal Thoughts:  No  Memory:  Immediate;   Fair Recent;   Poor  Judgement:  Impaired  Insight:  Shallow  Psychomotor Activity:  Decreased  Concentration:  Fair  Recall:  [place]: Good  Akathisia:  Negative  Handed:  Right  AIMS (if indicated):     Assets:  Communication Skills Desire for Improvement Financial Resources/Insurance Housing Leisure Time Resilience  ADL's:  Impaired  Cognition: Impaired,  Mild  Sleep:      Treatment Plan Summary: Daily contact with patient to assess and evaluate symptoms and progress in treatment and Medication management  Patient benefit from the acute psychiatric [facility] as he was not able to care for himself and has no known support system Patient continues to be somatic delusional about death and physical health We'll start risperidone 0.25 mg 2 times daily, if he cannot swallow his pills may need to use Geodon 10 mg IM twice daily for somatic delusions and psychosis not otherwise specified. LCSW to contact tenants from his home to gather more information regarding family support or friend support and also collateral information regarding past medical and psychiatric treatment needs.   Disposition: Recommend psychiatric Inpatient admission when medically cleared. Supportive therapy provided about ongoing stressors.  Halona Amstutz,JANARDHAHA R. 07/15/2015 11:47 AM

## 2015-07-15 NOTE — Progress Notes (Addendum)
Seeking inpatient gero-psych placement for pt.  Referred to: [facility]- per [employer]- per [place] per [facility]- per Kellie Moor- per Pamala Hurry  Declined: [facility] per Candace due to not performing ADLs on his own (unit unequipped for this) [facility]- per [facility] unit requires prior dementia dx  Sharren Bridge, MSW, [employer] Work, Disposition  07/15/2015 [phone]

## 2015-07-15 NOTE — ED Notes (Signed)
As this EMT was about to transfer pt to Pod C, pt complained of "feeling numb all over," then complained about his lightweight blanket and sheet "causing horrific pain" as this EMT tried to cover him before moving him to the hall.  When asked about what brought him to the ED today, he shrugged and was unwilling to respond. Pt exhibited a flattened affect and seemed generally disheveled.  Pt finally stated that he "had not eaten for days."  This EMT resorted to Yes/No questions when pt stopped responding stating that he was "too tired and weak to speak."  Pt was willing to move his head to reply Yes or No since he no longer needed to talk.  This EMT could not continue to assist this pt due to a sitter shortage that required another assignment in [place].

## 2015-07-16 LAB — CBG MONITORING, ED
GLUCOSE-CAPILLARY: 219 mg/dL — AB (ref 65–99)
Glucose-Capillary: 248 mg/dL — ABNORMAL HIGH (ref 65–99)

## 2015-07-16 NOTE — Progress Notes (Signed)
Followed up on inpatient gero-psych referral efforts:  Referred to: [facility]- per Gerald Stabs, may have received yesterday but not reviewed- re-fax to have referral at top of queue today [facility]- per Claudina Lick- per Nunzio Cory, received referral yesterday, checking on status of referral and will follow-up [facility] Doffing- per Mechele Claude, seems referral sent yesterday was not received- refax for review today  Declined: [facility]- needing assistance with ADLs [facility]- same as above [place] unit requires prior dementia dx [place] Medicare coverage is out of their network  Sharren Bridge, MSW, [place] Work, Disposition  07/16/2015 [phone]

## 2015-07-16 NOTE — ED Notes (Signed)
Patient offered snack patient refused. Patient dinner tray ordered.

## 2015-07-16 NOTE — ED Notes (Signed)
MD made aware of CBG.

## 2015-07-16 NOTE — ED Notes (Signed)
Spoke with [place] called to see if patient needed to be IVC advised patient is Voluntary and is not expressing any need to want to go home. Patient has not attempted to leave.

## 2015-07-16 NOTE — Progress Notes (Addendum)
Patient voluntary, per Gerald Stabs pt accepted at [facility], to Dr. Launa Grill, call report at [phone], if pt can't arrive by 11pm, please send pt after 7am on 07/17/15. RN Carmelina Paddock informed.  Verlon Setting, [place] Disposition staff 07/16/2015 7:22 PM

## 2015-07-16 NOTE — ED Notes (Signed)
Patient has diabetic hx spoke with MD Vanita Panda. Advised no medications have been given since patient has been here. MD agreeable monitor CBG before meals. If over 250 notify MD.

## 2015-07-16 NOTE — ED Notes (Signed)
Breakfast tray delivered

## 2015-07-16 NOTE — ED Notes (Signed)
[facility] Contacted for transport

## 2015-07-16 NOTE — ED Notes (Addendum)
Patient was given a snack and diet drink. A regular diet ordered for lunch.

## 2015-07-16 NOTE — ED Notes (Signed)
Lunch Tray Delivered.

## 2015-07-16 NOTE — ED Notes (Signed)
Patient refuses to eat Dinner, Patient states, " just let me die".

## 2015-07-16 NOTE — Progress Notes (Signed)
Nunzio Cory at [facility] advises she needs pt's labs and updated vitals and IVC copies. CSW explained ot is voluntary at this time and faxed needed labs. Awaiting review.  Sharren Bridge, MSW, LCSW Clinical Social Work, Disposition  07/16/2015 [phone]

## 2015-07-16 NOTE — ED Notes (Signed)
Attempted to assess patient, patient refused to talk. Patient refused to provide eye contact with nurse.

## 2015-07-16 NOTE — ED Notes (Signed)
Pt appears very restless, RN asked if he wanted some medication to assist him in relaxing.  Pt initially agreed however refused when RN brought the medication bedside.  When RN asked what made him change his mind he began whimpering.  RN reassured him he was "OK" and pt calmed down.

## 2015-07-16 NOTE — ED Notes (Signed)
This Probation officer was informed by [facility] transportation that [facility] refused to take the patient after being accepted by their MD and was sending the patient back. No staff member from the facility ever called to inform any staff member here at [facility] of the patients return and the reasoning behind their refusal. This RN contacted Vincente Poli, Agricultural consultant at [facility] unit to inquire about the patients return and the set of events that took place as well as find out who did someone speak to and inform of the pts return back to [facility] ED, I was then transferred to Turner Daniels, RN and she stated that she spoke with her unit manager Stan Head as well as her on call physician Katharine Look Hipple and they advised her that the patient was not a safe admit and advised her to send the patient back to the transferring facility. This Probation officer spoke with the on call physician Madilyn Fireman, MD and inquired about what was going on. She then informed me that she received the call from the unit and was informed by [facility] Freestone that the pt arrived to the unit without any paperwork or voluntary commitment  Papers, this Rn then informed Dr. Christie Nottingham that the patient was sent with a packet that included his signed consent for transfer signed by him, belongings slip for items sent with pt to there facility, his [facility] assessment and the EMTALA. Dr. Christie Nottingham then informed me that she was not aware of the papers that were present at the time, that she took the staffs word for what they were saying and would contact them back and find out if they seen the paperwork that was sent with the patient and would contact this writer back. Lilia Pro, Charge RN aware of the situation and current findings. This RN also spoke with Catia at [facility] to inform her of the set of events that have occurred with the patient.

## 2015-07-16 NOTE — Progress Notes (Signed)
Received call from [facility] with regards to patient being sent back to the ED by [facility] staff due to patient not wanting to sign himself for admission. Patient meets inpatient criteria, per NP Arlester Marker. Patient was accepted at [facility], per [facility]. Chris at [facility] informed this Probation officer that if patient was voluntary that was fine and that there was not a need for patient to be IVC'd for admission purposes.   This Probation officer will bring the incident to the attention of to the social work supervisors and seek further assistance with appropriate protocol.  Verlon Setting, [place] Disposition staff 07/16/2015 10:55 PM

## 2015-07-16 NOTE — ED Notes (Signed)
Dinner Delivered, patient refused to eat.

## 2015-07-17 LAB — BASIC METABOLIC PANEL
ANION GAP: 10 (ref 5–15)
BUN: 17 mg/dL (ref 6–20)
CALCIUM: 9 mg/dL (ref 8.9–10.3)
CO2: 24 mmol/L (ref 22–32)
Chloride: 105 mmol/L (ref 101–111)
Creatinine, Ser: 0.76 mg/dL (ref 0.61–1.24)
Glucose, Bld: 360 mg/dL — ABNORMAL HIGH (ref 65–99)
POTASSIUM: 3.8 mmol/L (ref 3.5–5.1)
Sodium: 139 mmol/L (ref 135–145)

## 2015-07-17 LAB — CBC WITH DIFFERENTIAL/PLATELET
BASOS ABS: 0 10*3/uL (ref 0.0–0.1)
BASOS PCT: 1 %
Eosinophils Absolute: 0.2 10*3/uL (ref 0.0–0.7)
Eosinophils Relative: 2 %
HEMATOCRIT: 39 % (ref 39.0–52.0)
Hemoglobin: 13.1 g/dL (ref 13.0–17.0)
LYMPHS ABS: 2.7 10*3/uL (ref 0.7–4.0)
LYMPHS PCT: 33 %
MCH: 30.2 pg (ref 26.0–34.0)
MCHC: 33.6 g/dL (ref 30.0–36.0)
MCV: 89.9 fL (ref 78.0–100.0)
MONO ABS: 0.8 10*3/uL (ref 0.1–1.0)
Monocytes Relative: 9 %
NEUTROS ABS: 4.5 10*3/uL (ref 1.7–7.7)
Neutrophils Relative %: 55 %
PLATELETS: 218 10*3/uL (ref 150–400)
RBC: 4.34 MIL/uL (ref 4.22–5.81)
RDW: 13.6 % (ref 11.5–15.5)
WBC: 8.2 10*3/uL (ref 4.0–10.5)

## 2015-07-17 LAB — CBG MONITORING, ED: Glucose-Capillary: 237 mg/dL — ABNORMAL HIGH (ref 65–99)

## 2015-07-17 MED ORDER — METFORMIN HCL 500 MG PO TABS
1000.0000 mg | ORAL_TABLET | Freq: Every day | ORAL | Status: DC
  Filled 2015-07-17 (×2): qty 2

## 2015-07-17 MED ORDER — DOCUSATE SODIUM 100 MG PO CAPS
100.0000 mg | ORAL_CAPSULE | Freq: Every day | ORAL | Status: DC
  Filled 2015-07-17 (×2): qty 1

## 2015-07-17 NOTE — ED Notes (Signed)
IVC paperwork filled out by Dr. Johnney Killian, paperwork given to pod B secretary for notary and fax to magistrate.

## 2015-07-17 NOTE — ED Notes (Signed)
Explained to patient the importance of medications that were ordered, patient just shook his head no when asked if he would take medications.

## 2015-07-17 NOTE — ED Notes (Signed)
Dr. Pfeiffer at bedside to assess patient.  

## 2015-07-17 NOTE — ED Provider Notes (Signed)
Patient had been accepted and transferred to [facility] and upon arrival reportedly was disorganized in thought and not felt to have capacity to sign voluntary commitment. Without  IVC they did not feel he could be To their facility and thus sent him back to [facility] emergency department.  I had been asked to fill out an IVC for the patient. Behavior Health felt he met qualification for inpatient treatment.  I have reviewed the patient's EMR.  Interviewing the patient, he is with drawn and fearful. He asks me specifically not to ask him questions. He tells me that he physically can't speak but he does. He is awake and alert. His voice is clear without stridor. Heart is regular without rub or gallop. Lungs are clear without wheeze rhonchi rail. No respiratory distress. Patient reports that there is something wrong with his abdomen and doesn't want me to examine it. His abdomen however is soft without palpable mass and there is no guarding present. Once I begin to perform the exam it does not seem to bother him. There is no peripheral edema.   Patient ate a normal breakfast this morning for his sitter. At this point patient appears to have decompensated paranoia and delusions. The patient does not show signs of being safe for self-management or capacity for medical decision-making. At this time IVC will be completed.  Charlesetta Shanks, MD 07/17/15 [phone]

## 2015-07-17 NOTE — ED Notes (Signed)
Patient was given gram crackers, peanut butter for snack and a sprite, and a regular diet was ordered for lunch.

## 2015-07-17 NOTE — ED Notes (Signed)
Staffing notified of the need for a sitter this am.

## 2015-07-17 NOTE — ED Notes (Signed)
CSW Caryl Pina advised patient will need to be IVC'd in order to be placed in a facility. Dr. Johnney Killian to be made aware of this.

## 2015-07-17 NOTE — ED Notes (Signed)
This RN asked patient if he would like to sit up in bed to eat his lunch, patient just stared at RN with no reply. This RN repeated patient to question and requested his response, Patient simply mouthed "NO" and rolled back over on the stretcher.

## 2015-07-17 NOTE — ED Notes (Signed)
Dr. Johnney Killian made aware of patients glucose of 360 in lab work results

## 2015-07-17 NOTE — ED Notes (Signed)
IVC paperwork faxed to [facility] 

## 2015-07-17 NOTE — ED Notes (Signed)
While rounding on the pt, pt stated that "there is no next step in the process. I am supposed to die here." Pt has no plan to kill himself at this time.

## 2015-07-17 NOTE — ED Notes (Signed)
[employer] in ED to serve IVC paperwork.

## 2015-07-17 NOTE — ED Notes (Signed)
Lunch tray at bedside. ?

## 2015-07-17 NOTE — ED Notes (Signed)
Dr. Johnney Killian made aware of situation with patient needing to be IVC'd, advised she will assess patient as soon as she can.

## 2015-07-17 NOTE — Progress Notes (Addendum)
Chris, intake at [facility], called to state that, last night when pt arrived for admission, "he was disoriented x 4, unable to state his name. Disorganized, wandering around. Could not sign voluntary admission paperwork. As per our policy (that pt's not under IVC who are unable to sign themselves in are to be transported back to sending facility), our charge RN consulted with oncall physician and was advised pt did not seem to have the capacity to knowingly consent to inpatient treatment and that he return to sending ED." Gerald Stabs states offer of bed is rescinded but advises pt can be re-referred should he be under IVC prior to transfer.   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   Will proceed with appropriate referrals pending change in pt's commitment status.   Sharren Bridge, MSW, LCSW Clinical Social Work, Disposition  07/17/2015 [phone]

## 2015-07-17 NOTE — ED Notes (Signed)
CBG 237

## 2015-07-17 NOTE — Progress Notes (Addendum)
22:10  Medical laboratory scientific officer for [facility] at [facility] inquiring if patient's referral has been received.   17:44 Writer received patient's IVC papers and added them to the referral packet for [facility].  Referral has been faxed to [facility] and [facility] will continue to follow up.  Verlon Setting, [place] Disposition staff 07/17/2015 5:46 PM

## 2015-07-18 LAB — CBG MONITORING, ED: GLUCOSE-CAPILLARY: 252 mg/dL — AB (ref 65–99)

## 2015-07-18 NOTE — Progress Notes (Signed)
Faxed copies of Pt's IVC and most recent vitals to [facility].  Pamala Hurry, intake RN, states pt is accepted by Dr. Launa Grill to [facility] bed 145. Number to call report is [phone], Pamala Hurry asks that report be called when transport has arrived for pt. (Notes that [facility] intakes occur until 11pm daily then begin again at [place]- if there happens to be a delay in transport- pt's bed would be held until tomorrow morning after 6am.)   Sharren Bridge, MSW, [place] Work, Disposition  07/18/2015 [phone]

## 2015-07-18 NOTE — ED Notes (Signed)
Patient was given a snack and drink. Tuna Salad Cold plate ordered for patient for lunch with jello, and sprite zero.

## 2015-07-18 NOTE — ED Notes (Signed)
Patient refused  V/S, Nurse Santiago Glad was informed.

## 2015-07-18 NOTE — ED Notes (Signed)
Patient CBG was 252, The Nurse was informed.

## 2015-07-18 NOTE — Progress Notes (Addendum)
APS worker, [place] BS MA [phone] or [phone]), present and assessing patient at this time. Ms. Dema Severin provided CSW with Release of Records documentation. Release placed on Patient's shadow chart. Ms. Dema Severin provided with [facility]. CSW will continue to follow for disposition.   10:37- CSW spoke with Ms. White who reports after visiting the home, the home is unsafe for patient to return. Ms. Dema Severin is requesting to updated with disposition.   Holly Bodily, Latanya Presser [facility] ED Clinical Social Worker [phone]

## 2015-07-18 NOTE — ED Notes (Signed)
Pt's friend at bedside-- is only contact for patient-- Lula Olszewski [phone]. Pt gave permission to share information.

## 2016-02-25 IMAGING — CR DG ABDOMEN ACUTE W/ 1V CHEST
3 series · 3 of 3 positions shown · non-contrast
Comparison: CT abdomen/pelvis 03/18/2014

CLINICAL DATA: Abdominal pain and weight loss. General decline in
health for months with limited p.o. intake.

EXAM:
DG ABDOMEN ACUTE W/ 1V CHEST

[abdomen erect]
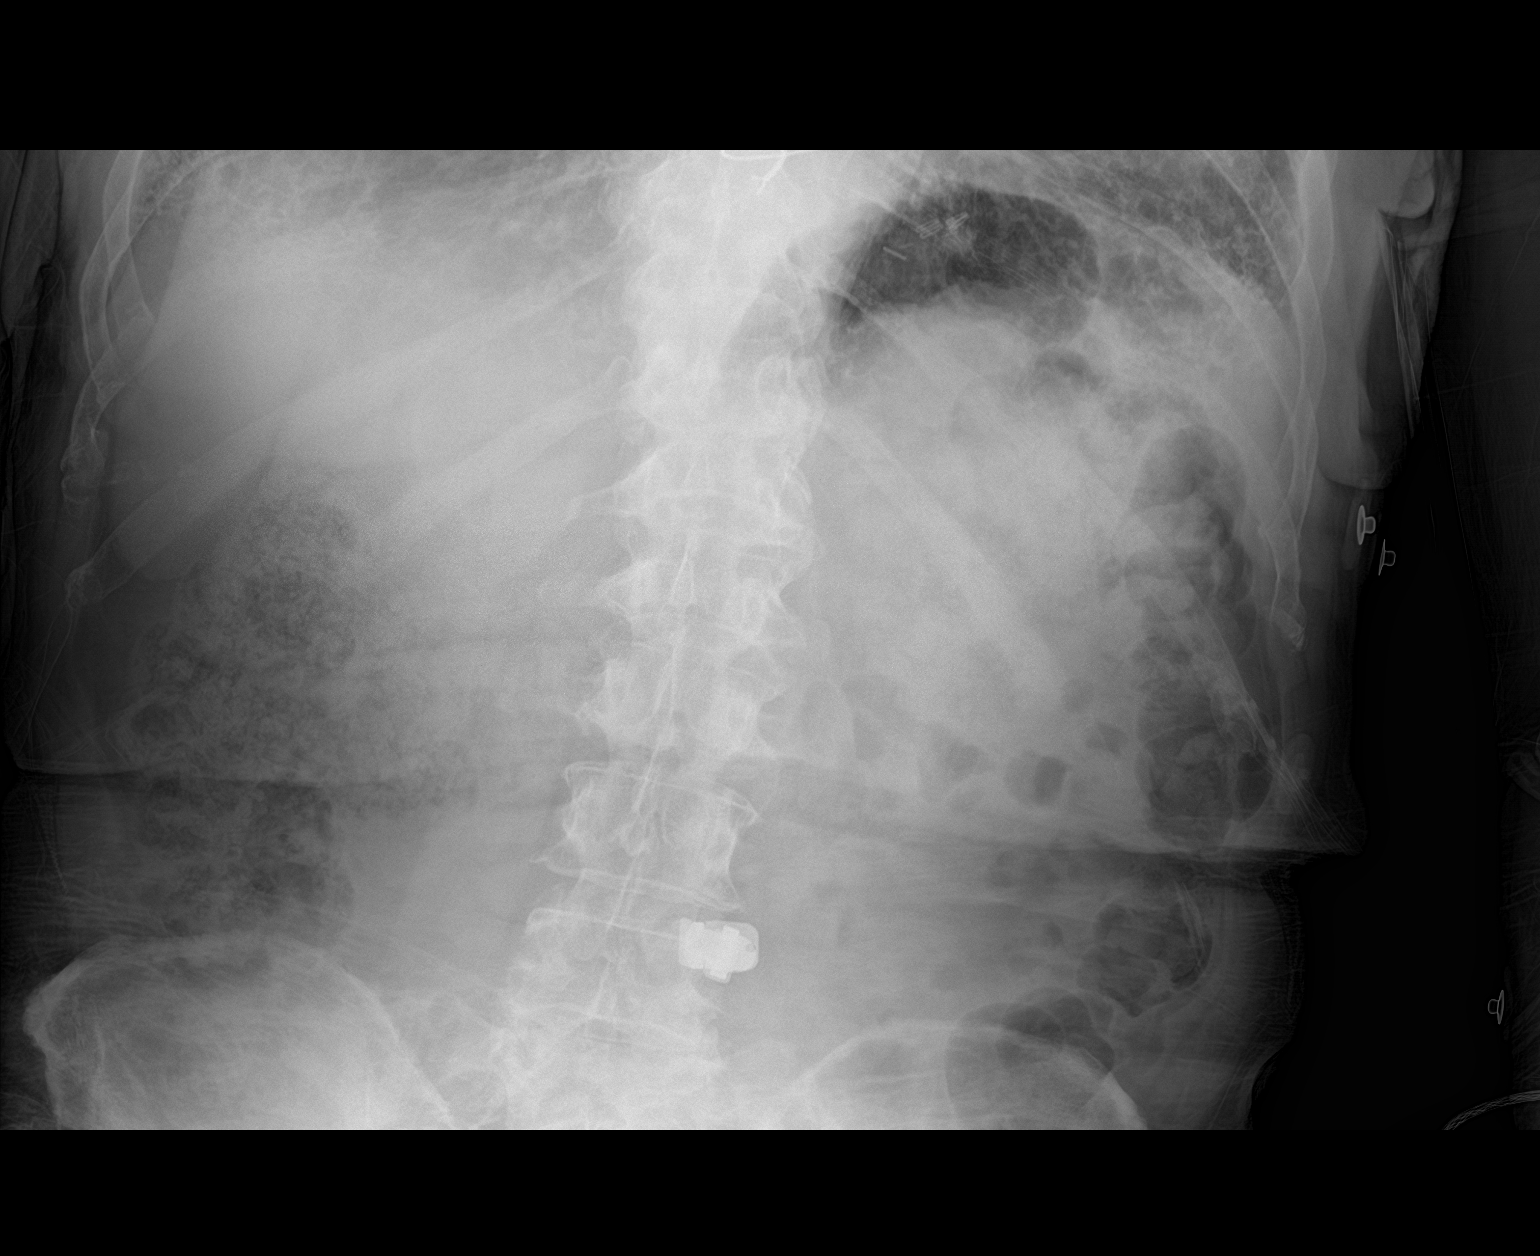

[abdomen supine]
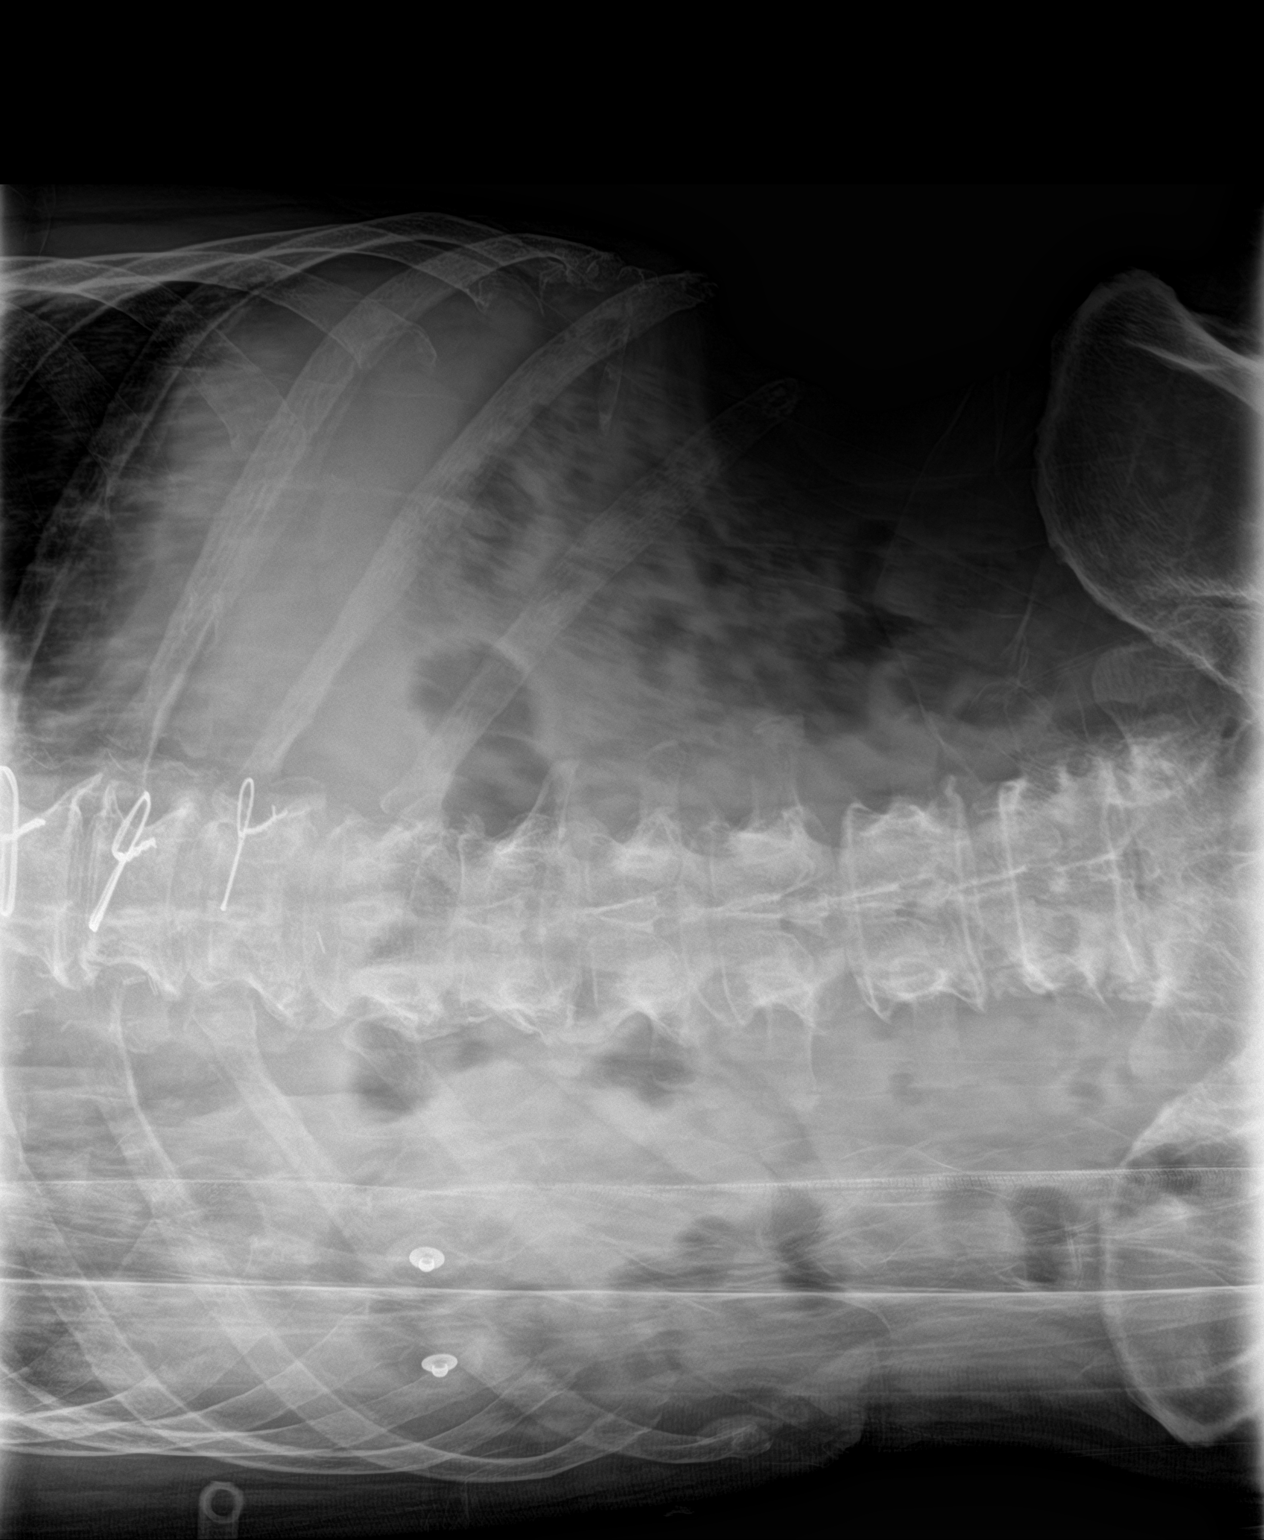

[chest ap]
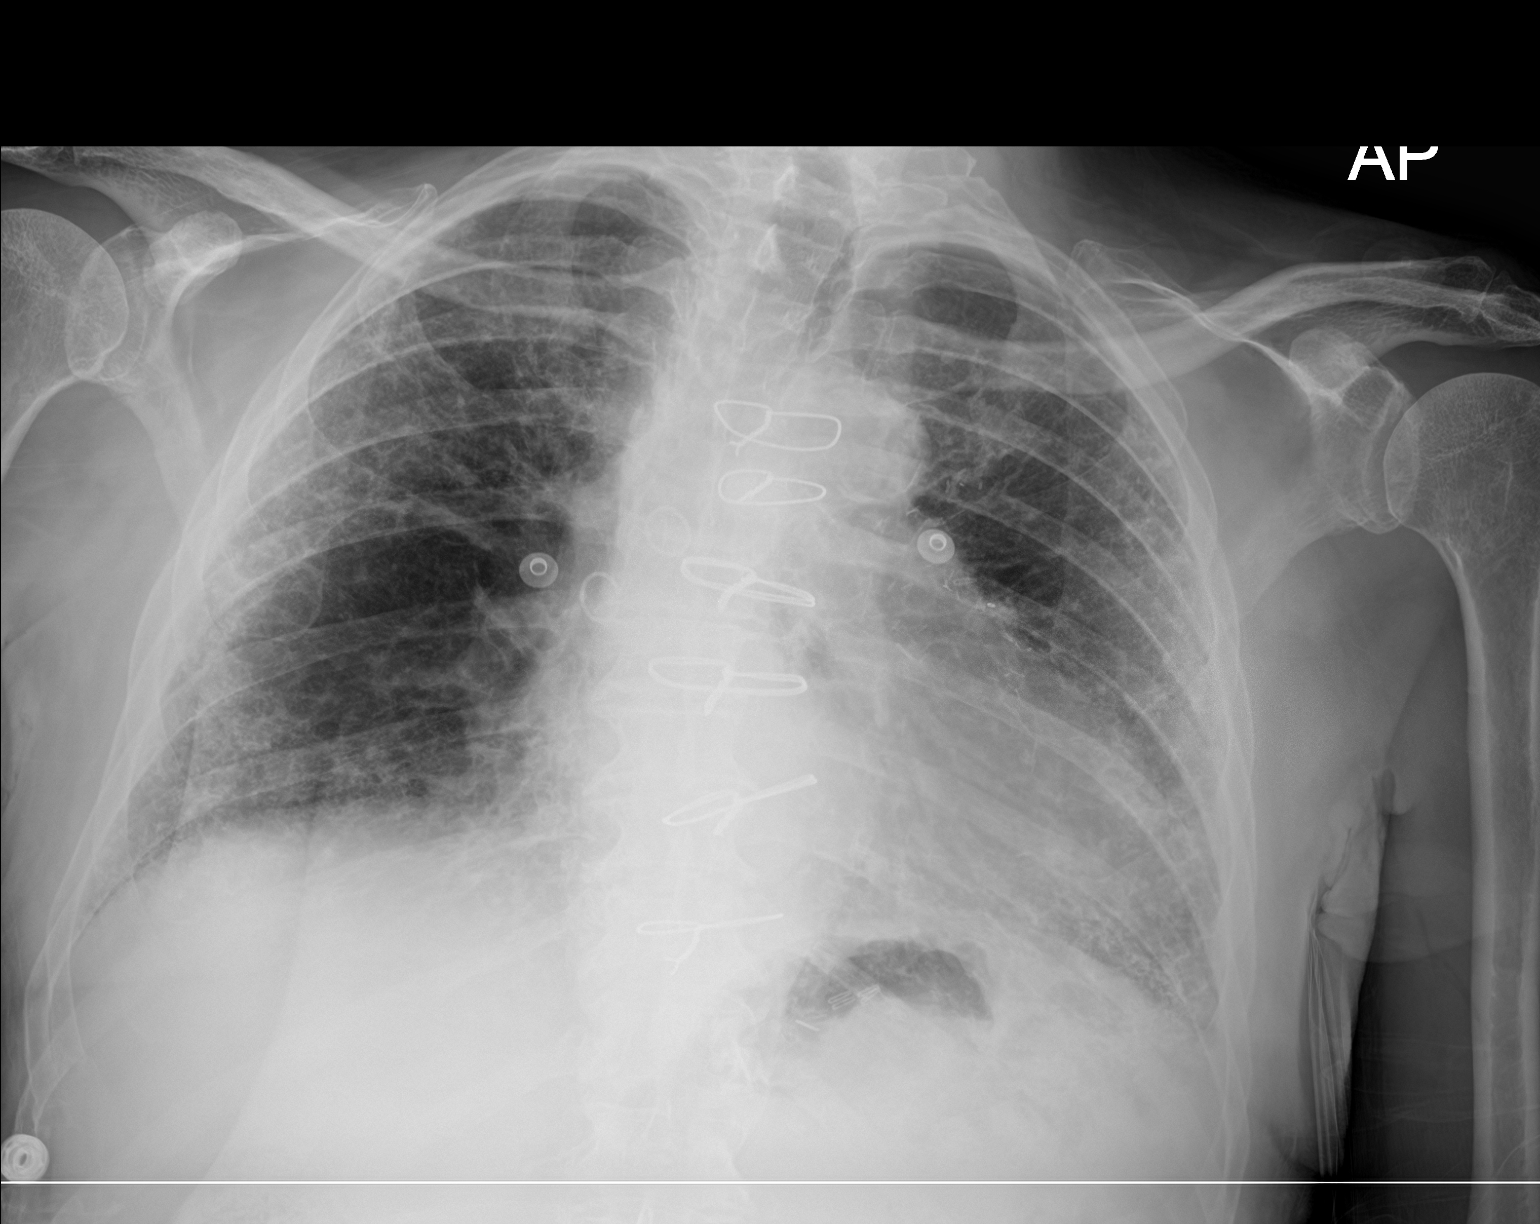

[3 of 3 positions shown; findings below may reference images not displayed]

FINDINGS: Patient is post median sternotomy. Heart at the upper limits normal
in size. Peripheral and basilar honeycombing consistent with
fibrosis. No superimposed consolidation.

No free intra-abdominal air. No dilated bowel loops to suggest
obstruction. Small to moderate volume of colonic stool. No
radiopaque calculi. Pelvis from the mid iliac wings and caudally not
included in the field of views. No acute osseous abnormalities are
seen.
IMPRESSION: 1. Normal bowel gas pattern. No free air. Pelvis excluded from the
field of view.
2. Pulmonary fibrosis.

## 2016-04-05 DEATH — deceased
